# Patient Record
Sex: Female | Born: 1995 | Race: White | Hispanic: No | Marital: Single | Smoking: Former smoker
Health system: Southern US, Community
[De-identification: ages and names within clinical notes are randomized; demographics above are authoritative.]

## PROBLEM LIST (undated history)

## (undated) ENCOUNTER — Inpatient Hospital Stay (HOSPITAL_COMMUNITY): Payer: Self-pay

## (undated) DIAGNOSIS — G43909 Migraine, unspecified, not intractable, without status migrainosus: Secondary | ICD-10-CM

## (undated) DIAGNOSIS — F419 Anxiety disorder, unspecified: Secondary | ICD-10-CM

## (undated) DIAGNOSIS — F32A Depression, unspecified: Secondary | ICD-10-CM

## (undated) DIAGNOSIS — F329 Major depressive disorder, single episode, unspecified: Secondary | ICD-10-CM

## (undated) HISTORY — PX: WISDOM TOOTH EXTRACTION: SHX21

---
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
## 2017-01-11 ENCOUNTER — Encounter: Payer: Self-pay | Admitting: Advanced Practice Midwife

## 2017-01-25 ENCOUNTER — Encounter: Payer: Self-pay | Admitting: Advanced Practice Midwife

## 2017-01-25 ENCOUNTER — Other Ambulatory Visit (HOSPITAL_COMMUNITY)
Admission: RE | Admit: 2017-01-25 | Discharge: 2017-01-25 | Disposition: A | Discharge status: Home / Self Care | Payer: Medicaid Other | Source: Ambulatory Visit | Attending: Advanced Practice Midwife | Admitting: Advanced Practice Midwife

## 2017-01-25 ENCOUNTER — Ambulatory Visit (INDEPENDENT_AMBULATORY_CARE_PROVIDER_SITE_OTHER): Payer: Medicaid Other | Admitting: Advanced Practice Midwife

## 2017-01-25 VITALS — BP 102/67 | HR 73 | Ht 64.0 in | Wt 158.0 lb

## 2017-01-25 DIAGNOSIS — O219 Vomiting of pregnancy, unspecified: Secondary | ICD-10-CM

## 2017-01-25 DIAGNOSIS — Z3492 Encounter for supervision of normal pregnancy, unspecified, second trimester: Secondary | ICD-10-CM

## 2017-01-25 DIAGNOSIS — Z3490 Encounter for supervision of normal pregnancy, unspecified, unspecified trimester: Secondary | ICD-10-CM | POA: Diagnosis not present

## 2017-01-25 DIAGNOSIS — Z3482 Encounter for supervision of other normal pregnancy, second trimester: Secondary | ICD-10-CM | POA: Diagnosis not present

## 2017-01-25 MED ORDER — DOXYLAMINE-PYRIDOXINE 10-10 MG PO TBEC: DELAYED_RELEASE_TABLET | ORAL | 5 refills | Status: DC

## 2017-01-25 NOTE — Patient Instructions (Signed)
Morning Sickness °Morning sickness is when you feel sick to your stomach (nauseous) during pregnancy. This nauseous feeling may or may not come with vomiting. It often occurs in the morning but can be a problem any time of day. Morning sickness is most common during the first trimester, but it may continue throughout pregnancy. While morning sickness is unpleasant, it is usually harmless unless you develop severe and continual vomiting (hyperemesis gravidarum). This condition requires more intense treatment. °What are the causes? °The cause of morning sickness is not completely known but seems to be related to normal hormonal changes that occur in pregnancy. °What increases the risk? °You are at greater risk if you: °· Experienced nausea or vomiting before your pregnancy. °· Had morning sickness during a previous pregnancy. °· Are pregnant with more than one baby, such as twins. °How is this treated? °Do not use any medicines (prescription, over-the-counter, or herbal) for morning sickness without first talking to your health care provider. Your health care provider may prescribe or recommend: °· Vitamin B6 supplements. °· Anti-nausea medicines. °· The herbal medicine ginger. °Follow these instructions at home: °· Only take over-the-counter or prescription medicines as directed by your health care provider. °· Taking multivitamins before getting pregnant can prevent or decrease the severity of morning sickness in most women. °· Eat a piece of dry toast or unsalted crackers before getting out of bed in the morning. °· Eat five or six small meals a day. °· Eat dry and bland foods (rice, baked potato). Foods high in carbohydrates are often helpful. °· Do not drink liquids with your meals. Drink liquids between meals. °· Avoid greasy, fatty, and spicy foods. °· Get someone to cook for you if the smell of any food causes nausea and vomiting. °· If you feel nauseous after taking prenatal vitamins, take the vitamins at  night or with a snack. °· Snack on protein foods (nuts, yogurt, cheese) between meals if you are hungry. °· Eat unsweetened gelatins for desserts. °· Wearing an acupressure wristband (worn for sea sickness) may be helpful. °· Acupuncture may be helpful. °· Do not smoke. °· Get a humidifier to keep the air in your house free of odors. °· Get plenty of fresh air. °Contact a health care provider if: °· Your home remedies are not working, and you need medicine. °· You feel dizzy or lightheaded. °· You are losing weight. °Get help right away if: °· You have persistent and uncontrolled nausea and vomiting. °· You pass out (faint). °This information is not intended to replace advice given to you by your health care provider. Make sure you discuss any questions you have with your health care provider. °Document Released: 07/05/2006 Document Revised: 10/20/2015 Document Reviewed: 10/29/2012 °Elsevier Interactive Patient Education © 2017 [employer] ° °Second Trimester of Pregnancy °The second trimester is from week 14 through week 27 (months 4 through 6). The second trimester is often a time when you feel your best. Your body has adjusted to being pregnant, and you begin to feel better physically. Usually, morning sickness has lessened or quit completely, you may have more energy, and you may have an increase in appetite. The second trimester is also a time when the fetus is growing rapidly. At the end of the sixth month, the fetus is about 9 inches long and weighs about 1½ pounds. You will likely begin to feel the baby move (quickening) between 16 and 20 weeks of pregnancy. °Body changes during your second trimester °Your body continues   to go through many changes during your second trimester. The changes vary from woman to woman. °· Your weight will continue to increase. You will notice your lower abdomen bulging out. °· You may begin to get stretch marks on your hips, abdomen, and breasts. °· You may develop headaches  that can be relieved by medicines. The medicines should be approved by your health care provider. °· You may urinate more often because the fetus is pressing on your bladder. °· You may develop or continue to have heartburn as a result of your pregnancy. °· You may develop constipation because certain hormones are causing the muscles that push waste through your intestines to slow down. °· You may develop hemorrhoids or swollen, bulging veins (varicose veins). °· You may have back pain. This is caused by: °¨ Weight gain. °¨ Pregnancy hormones that are relaxing the joints in your pelvis. °¨ A shift in weight and the muscles that support your balance. °· Your breasts will continue to grow and they will continue to become tender. °· Your gums may bleed and may be sensitive to brushing and flossing. °· Dark spots or blotches (chloasma, mask of pregnancy) may develop on your face. This will likely fade after the baby is born. °· A dark line from your belly button to the pubic area (linea nigra) may appear. This will likely fade after the baby is born. °· You may have changes in your hair. These can include thickening of your hair, rapid growth, and changes in texture. Some women also have hair loss during or after pregnancy, or hair that feels dry or thin. Your hair will most likely return to normal after your baby is born. °What to expect at prenatal visits °During a routine prenatal visit: °· You will be weighed to make sure you and the fetus are growing normally. °· Your blood pressure will be taken. °· Your abdomen will be measured to track your baby's growth. °· The fetal heartbeat will be listened to. °· Any test results from the previous visit will be discussed. °Your health care provider may ask you: °· How you are feeling. °· If you are feeling the baby move. °· If you have had any abnormal symptoms, such as leaking fluid, bleeding, severe headaches, or abdominal cramping. °· If you are using any tobacco  products, including cigarettes, chewing tobacco, and electronic cigarettes. °· If you have any questions. °Other tests that may be performed during your second trimester include: °· Blood tests that check for: °¨ Low iron levels (anemia). °¨ High blood sugar that affects pregnant women (gestational diabetes) between 24 and 28 weeks. °¨ Rh antibodies. This is to check for a protein on red blood cells (Rh factor). °· Urine tests to check for infections, diabetes, or protein in the urine. °· An ultrasound to confirm the proper growth and development of the baby. °· An amniocentesis to check for possible genetic problems. °· Fetal screens for spina bifida and Down syndrome. °· HIV (human immunodeficiency virus) testing. Routine prenatal testing includes screening for HIV, unless you choose not to have this test. °Follow these instructions at home: °Medicines  °· Follow your health care provider's instructions regarding medicine use. Specific medicines may be either safe or unsafe to take during pregnancy. °· Take a prenatal vitamin that contains at least 600 micrograms (mcg) of folic acid. °· If you develop constipation, try taking a stool softener if your health care provider approves. °Eating and drinking  °· Eat a balanced diet   that includes fresh fruits and vegetables, whole grains, good sources of protein such as meat, eggs, or tofu, and low-fat dairy. Your health care provider will help you determine the amount of weight gain that is right for you. °· Avoid raw meat and uncooked cheese. These carry germs that can cause birth defects in the baby. °· If you have low calcium intake from food, talk to your health care provider about whether you should take a daily calcium supplement. °· Limit foods that are high in fat and processed sugars, such as fried and sweet foods. °· To prevent constipation: °¨ Drink enough fluid to keep your urine clear or pale yellow. °¨ Eat foods that are high in fiber, such as fresh fruits  and vegetables, whole grains, and beans. °Activity  °· Exercise only as directed by your health care provider. Most women can continue their usual exercise routine during pregnancy. Try to exercise for 30 minutes at least 5 days a week. Stop exercising if you experience uterine contractions. °· Avoid heavy lifting, wear low heel shoes, and practice good posture. °· A sexual relationship may be continued unless your health care provider directs you otherwise. °Relieving pain and discomfort  °· Wear a good support bra to prevent discomfort from breast tenderness. °· Take warm sitz baths to soothe any pain or discomfort caused by hemorrhoids. Use hemorrhoid cream if your health care provider approves. °· Rest with your legs elevated if you have leg cramps or low back pain. °· If you develop varicose veins, wear support hose. Elevate your feet for 15 minutes, 3-4 times a day. Limit salt in your diet. °Prenatal Care  °· Write down your questions. Take them to your prenatal visits. °· Keep all your prenatal visits as told by your health care provider. This is important. °Safety  °· Wear your seat belt at all times when driving. °· Make a list of emergency phone numbers, including numbers for family, friends, the hospital, and police and fire departments. °General instructions  °· Ask your health care provider for a referral to a local prenatal education class. Begin classes no later than the beginning of month 6 of your pregnancy. °· Ask for help if you have counseling or nutritional needs during pregnancy. Your health care provider can offer advice or refer you to specialists for help with various needs. °· Do not use hot tubs, steam rooms, or saunas. °· Do not douche or use tampons or scented sanitary pads. °· Do not cross your legs for long periods of time. °· Avoid cat litter boxes and soil used by cats. These carry germs that can cause birth defects in the baby and possibly loss of the fetus by miscarriage or  stillbirth. °· Avoid all smoking, herbs, alcohol, and unprescribed drugs. Chemicals in these products can affect the formation and growth of the baby. °· Do not use any products that contain nicotine or tobacco, such as cigarettes and e-cigarettes. If you need help quitting, ask your health care provider. °· Visit your dentist if you have not gone yet during your pregnancy. Use a soft toothbrush to brush your teeth and be gentle when you floss. °Contact a health care provider if: °· You have dizziness. °· You have mild pelvic cramps, pelvic pressure, or nagging pain in the abdominal area. °· You have persistent nausea, vomiting, or diarrhea. °· You have a bad smelling vaginal discharge. °· You have pain when you urinate. °Get help right away if: °· You have a fever. °·   You are leaking fluid from your vagina. °· You have spotting or bleeding from your vagina. °· You have severe abdominal cramping or pain. °· You have rapid weight gain or weight loss. °· You have shortness of breath with chest pain. °· You notice sudden or extreme swelling of your face, hands, ankles, feet, or legs. °· You have not felt your baby move in over an hour. °· You have severe headaches that do not go away when you take medicine. °· You have vision changes. °Summary °· The second trimester is from week 14 through week 27 (months 4 through 6). It is also a time when the fetus is growing rapidly. °· Your body goes through many changes during pregnancy. The changes vary from woman to woman. °· Avoid all smoking, herbs, alcohol, and unprescribed drugs. These chemicals affect the formation and growth your baby. °· Do not use any tobacco products, such as cigarettes, chewing tobacco, and e-cigarettes. If you need help quitting, ask your health care provider. °· Contact your health care provider if you have any questions. Keep all prenatal visits as told by your health care provider. This is important. °This information is not intended to replace  advice given to you by your health care provider. Make sure you discuss any questions you have with your health care provider. °Document Released: 05/08/2001 Document Revised: 10/20/2015 Document Reviewed: 07/15/2012 °Elsevier Interactive Patient Education © 2017 [employer] ° °

## 2017-01-25 NOTE — Addendum Note (Signed)
Addended by: Faythe CasaBELLAMY, Shyla Gayheart M on: 01/25/2017 12:25 PM   Modules accepted: Orders

## 2017-01-25 NOTE — Progress Notes (Signed)
   PRENATAL VISIT NOTE  Subjective:  Shari Skinner is a 21 y.o. G2P0010 at 3719w4d being seen today for initial prenatal visit.  She is currently monitored for the following issues for this low-risk pregnancy and  does not have a problem list on file.  Patient reports fatigue and nausea. Reports vomiting at least once daily, nausea all day long.  Contractions: Not present. Vag. Bleeding: None.   . Denies leaking of fluid.   The following portions of the patient's history were reviewed and updated as appropriate: allergies, current medications, past family history, past medical history, past social history, past surgical history and problem list. Problem list updated.  Objective:   Vitals:   01/25/17 0959 01/25/17 1001  BP: 102/67   Pulse: 73   Weight: 158 lb (71.7 kg)   Height:  5\' 4"  (1.626 m)    Fetal Status: Fetal Heart Rate (bpm): 148         VS reviewed, nursing note reviewed,  Constitutional: well developed, well nourished, no distress HEENT: normocephalic HEART: normal rate, heart sounds, regular rhythm RESP: normal effort, lung sounds clear and equal bilaterally Abdomen: soft Neuro: alert and oriented x 3 Skin: warm, dry Psych: affect normal Pelvic: deferred, too young for Pap  Assessment and Plan:  Pregnancy: G2P0010 at 319w4d  1. Encounter for supervision of low-risk pregnancy, antepartum  - Culture, OB Urine - GC/Chlamydia probe amp ([facility]not at [facility] - Cystic fibrosis diagnostic study - AFP, Quad Screen - Hemoglobin A1c - Prenatal Profile - US MFM OB COMP + 14 WK; Future  2. Nausea and vomiting during pregnancy prior to [redacted] weeks gestation --Discussed dietary changes, small frequent meals, bland foods --Sample of Diclegis given, will need prior authorization for pharmacy to fill Rx. - Doxylamine-Pyridoxine (DICLEGIS) 10-10 MG TBEC; Take 2 tabs at bedtime. If needed, add another tab in the morning. If needed, add another tab in the afternoon, up to 4  tabs/day.  Dispense: 100 tablet; Refill: 5  Preterm labor symptoms and general obstetric precautions including but not limited to vaginal bleeding, contractions, leaking of fluid and fetal movement were reviewed in detail with the patient. Please refer to After Visit Summary for other counseling recommendations.  Return in about 4 weeks (around 02/22/2017).   Sharen CounterLisa Leftwich-Kirby, CNM

## 2017-01-26 LAB — HEMOGLOBIN A1C
HEMOGLOBIN A1C: 5 % (ref <5.7)
MEAN PLASMA GLUCOSE: 97 mg/dL

## 2017-01-26 LAB — CULTURE, OB URINE

## 2017-01-29 LAB — PRENATAL PROFILE (SOLSTAS)
ANTIBODY SCREEN: NEGATIVE
Basophils Absolute: 0 cells/uL (ref 0–200)
Basophils Relative: 0 %
Eosinophils Absolute: 0 cells/uL — ABNORMAL LOW (ref 15–500)
Eosinophils Relative: 0 %
HEMATOCRIT: 40.6 % (ref 35.0–45.0)
HEMOGLOBIN: 13.3 g/dL (ref 11.7–15.5)
HEP B S AG: NONREACTIVE
HIV 1&2 Ab, 4th Generation: NONREACTIVE
Lymphocytes Relative: 24 %
Lymphs Abs: 2160 cells/uL (ref 850–3900)
MCH: 30.6 pg (ref 27.0–33.0)
MCHC: 32.8 g/dL (ref 32.0–36.0)
MCV: 93.3 fL (ref 80.0–100.0)
MONOS PCT: 5 %
MPV: 10.6 fL (ref 7.5–12.5)
Monocytes Absolute: 450 cells/uL (ref 200–950)
NEUTROS ABS: 6390 {cells}/uL (ref 1500–7800)
NEUTROS PCT: 71 %
Platelets: 202 10*3/uL (ref 140–400)
RBC: 4.35 MIL/uL (ref 3.80–5.10)
RDW: 13.2 % (ref 11.0–15.0)
Rh Type: NEGATIVE
WBC: 9 10*3/uL (ref 3.8–10.8)

## 2017-01-30 LAB — AFP, QUAD SCREEN
AFP: 24.5 ng/mL
Age Alone: 1:1170 {titer}
CURR GEST AGE: 14.6 wk
Down Syndrome Scr Risk Est: 1:876 {titer}
HCG, Total: 49.63 IU/mL
INH: 490.6 pg/mL
INTERPRETATION-AFP: NEGATIVE
MoM for AFP: 0.91
MoM for INH: 2.67
MoM for hCG: 0.92
OPEN SPINA BIFIDA: NEGATIVE
TRI 18 SCR RISK EST: NEGATIVE
Trisomy 18 (Edward) Syndrome Interp.: 1:67300 {titer}
UE3 VALUE: 0.48 ng/mL
uE3 Mom: 0.99

## 2017-01-31 LAB — GC/CHLAMYDIA PROBE AMP (~~LOC~~) NOT AT ARMC
Chlamydia: NEGATIVE
Neisseria Gonorrhea: NEGATIVE

## 2017-02-05 ENCOUNTER — Encounter: Payer: Self-pay | Admitting: *Deleted

## 2017-02-12 ENCOUNTER — Ambulatory Visit (INDEPENDENT_AMBULATORY_CARE_PROVIDER_SITE_OTHER): Payer: Medicaid Other | Admitting: Obstetrics & Gynecology

## 2017-02-12 ENCOUNTER — Encounter: Payer: Self-pay | Admitting: Obstetrics & Gynecology

## 2017-02-12 VITALS — BP 109/70 | HR 70 | Wt 152.0 lb

## 2017-02-12 DIAGNOSIS — Z23 Encounter for immunization: Secondary | ICD-10-CM

## 2017-02-12 DIAGNOSIS — Z3490 Encounter for supervision of normal pregnancy, unspecified, unspecified trimester: Secondary | ICD-10-CM | POA: Diagnosis not present

## 2017-02-12 NOTE — Addendum Note (Signed)
Addended by: Kathie Dike on: 02/12/2017 02:12 PM   Modules accepted: Orders

## 2017-02-12 NOTE — Progress Notes (Signed)
PT complains of cramping in lower abdomen

## 2017-02-12 NOTE — Progress Notes (Signed)
   PRENATAL VISIT NOTE  Subjective:  Yeni Jiggetts is a 21 y.o. G2P0010 at [redacted]w[redacted]d being seen today for ongoing prenatal care.  She is currently monitored for the following issues for this low-risk pregnancy and has Encounter for supervision of low-risk pregnancy, antepartum and Nausea and vomiting during pregnancy prior to [redacted] weeks gestation on her problem list.  Patient reports This is an unscheduled visit for a week's worth of pelvic cramping, no bleeding..  Contractions: Not present. Vag. Bleeding: None.  Movement: Present. Denies leaking of fluid.   The following portions of the patient's history were reviewed and updated as appropriate: allergies, current medications, past family history, past medical history, past social history, past surgical history and problem list. Problem list updated.  Objective:   Vitals:   02/12/17 1335  BP: 109/70  Pulse: 70  Weight: 152 lb (68.9 kg)    Fetal Status: Fetal Heart Rate (bpm): 146   Movement: Present     General:  Alert, oriented and cooperative. Patient is in no acute distress.  Skin: Skin is warm and dry. No rash noted.   Cardiovascular: Normal heart rate noted  Respiratory: Normal respiratory effort, no problems with respiration noted  Abdomen: Soft, gravid, appropriate for gestational age.  Pain/Pressure: Present     Pelvic: Cervical exam performed        Extremities: Normal range of motion.  Edema: Trace  Mental Status:  Normal mood and affect. Normal behavior. Normal judgment and thought content.   Assessment and Plan:  Pregnancy: G2P0010 at [redacted]w[redacted]d  1. Encounter for supervision of low-risk pregnancy, antepartum - Quad screen today - anatomy u/s next week - Flu Vaccine QUAD 36+ mos IM (Fluarix, Quad PF) - Reassurance regarding the cramping given  Preterm labor symptoms and general obstetric precautions including but not limited to vaginal bleeding, contractions, leaking of fluid and fetal movement were reviewed in detail with  the patient. Please refer to After Visit Summary for other counseling recommendations.  No Follow-up on file.   Allie Bossier, MD

## 2017-02-13 LAB — AFP, QUAD SCREEN
AFP: 37.6 ng/mL
CURR GEST AGE: 17.1 wk
DOWN SYNDROME SCR RISK EST: POSITIVE — AB
HCG, Total: 33.66 IU/mL
INH: 398 pg/mL
MATERNAL WT: 152 [lb_av]
MoM for AFP: 1
MoM for INH: 2.42
MoM for hCG: 1.23
Trisomy 18 (Edward) Syndrome Interp.: <1
Twins-AFP: 1
UE3 VALUE: 0.56 ng/mL
uE3 Mom: 0.55

## 2017-02-13 LAB — URINE CULTURE
MICRO NUMBER:: 81030227
RESULT: NO GROWTH
SPECIMEN QUALITY:: ADEQUATE

## 2017-02-14 ENCOUNTER — Other Ambulatory Visit: Payer: Self-pay | Admitting: *Deleted

## 2017-02-14 DIAGNOSIS — Z3490 Encounter for supervision of normal pregnancy, unspecified, unspecified trimester: Secondary | ICD-10-CM

## 2017-02-14 DIAGNOSIS — O0990 Supervision of high risk pregnancy, unspecified, unspecified trimester: Secondary | ICD-10-CM

## 2017-02-14 NOTE — Progress Notes (Signed)
Pt notified of positive Quad screen.  Appt made with MFM and genetic counselor to discuss.  Appt 02/25/17 @ 9:15

## 2017-02-22 ENCOUNTER — Ambulatory Visit (INDEPENDENT_AMBULATORY_CARE_PROVIDER_SITE_OTHER): Payer: Medicaid Other | Admitting: Certified Nurse Midwife

## 2017-02-22 VITALS — BP 92/60 | HR 80 | Wt 153.0 lb

## 2017-02-22 DIAGNOSIS — F329 Major depressive disorder, single episode, unspecified: Secondary | ICD-10-CM

## 2017-02-22 DIAGNOSIS — O219 Vomiting of pregnancy, unspecified: Secondary | ICD-10-CM

## 2017-02-22 DIAGNOSIS — F32A Depression, unspecified: Secondary | ICD-10-CM | POA: Insufficient documentation

## 2017-02-22 DIAGNOSIS — F419 Anxiety disorder, unspecified: Secondary | ICD-10-CM

## 2017-02-22 DIAGNOSIS — Z3482 Encounter for supervision of other normal pregnancy, second trimester: Secondary | ICD-10-CM

## 2017-02-22 DIAGNOSIS — O285 Abnormal chromosomal and genetic finding on antenatal screening of mother: Secondary | ICD-10-CM

## 2017-02-22 DIAGNOSIS — O2612 Low weight gain in pregnancy, second trimester: Secondary | ICD-10-CM

## 2017-02-22 DIAGNOSIS — O2610 Low weight gain in pregnancy, unspecified trimester: Secondary | ICD-10-CM | POA: Insufficient documentation

## 2017-02-22 DIAGNOSIS — Z3490 Encounter for supervision of normal pregnancy, unspecified, unspecified trimester: Secondary | ICD-10-CM

## 2017-02-22 MED ORDER — SERTRALINE HCL 25 MG PO TABS: 25.0000 mg | ORAL_TABLET | Freq: Every day | ORAL | 1 refills | Status: DC

## 2017-02-22 MED ORDER — METOCLOPRAMIDE HCL 10 MG PO TABS: 10.0000 mg | ORAL_TABLET | Freq: Four times a day (QID) | ORAL | 1 refills | Status: DC | PRN

## 2017-02-22 NOTE — Progress Notes (Signed)
Pt is non-compliant because she stated that she has never received a BP cuff from [facility].  Given pt the number to call and let them know. Discuss her abn AFP

## 2017-02-22 NOTE — Progress Notes (Signed)
Subjective:  Shari Skinner is a 21 y.o. G2P0010 at [redacted]w[redacted]d being seen today for ongoing prenatal care.  She is currently monitored for the following issues for this low-risk pregnancy and has Encounter for supervision of low-risk pregnancy, antepartum; Nausea and vomiting during pregnancy prior to [redacted] weeks gestation; Anxiety and depression; and Abnormal chromosomal and genetic finding on antenatal screening mother on her problem list.  Patient reports nausea, vomiting and N/V still occurring most days. Using 1 tab of Diclegis at hs, has Zofran and Phenergan but doesn't use. Anxiety most days and feeling sad and angry. No SI/HI. Hx of depression and anxiety prior to pregnancy and stopped meds..  Contractions: Not present. Vag. Bleeding: None.  Movement: Present. Denies leaking of fluid.   The following portions of the patient's history were reviewed and updated as appropriate: allergies, current medications, past family history, past medical history, past social history, past surgical history and problem list. Problem list updated.  Objective:   Vitals:   02/22/17 0838  BP: 92/60  Pulse: 80  Weight: 153 lb (69.4 kg)    Fetal Status: Fetal Heart Rate (bpm): 150 Fundal Height: 18 cm Movement: Present     General:  Alert, oriented and cooperative. Patient is in no acute distress.  Skin: Skin is warm and dry. No rash noted.   Cardiovascular: Normal heart rate noted  Respiratory: Normal respiratory effort, no problems with respiration noted  Abdomen: Soft, gravid, appropriate for gestational age. Pain/Pressure: Absent     Pelvic: Vag. Bleeding: None Vag D/C Character: Thin   Cervical exam deferred        Extremities: Normal range of motion.  Edema: Trace  Mental Status: Normal mood and affect. Normal behavior. Normal judgment and thought content.   Urinalysis: Urine Protein: Negative Urine Glucose: Negative  Assessment and Plan:  Pregnancy: G2P0010 at [redacted]w[redacted]d  1. Encounter for supervision  of low-risk pregnancy, antepartum  2. Nausea and vomiting during pregnancy prior to [redacted] weeks gestation - increase Diclegis to 2 tabs at hs - metoCLOPramide (REGLAN) 10 MG tablet; Take 1 tablet (10 mg total) by mouth every 6 (six) hours as needed for nausea.  Dispense: 30 tablet; Refill: 1  3. Anxiety and depression - Ambulatory referral to Behavioral Health - sertraline (ZOLOFT) 25 MG tablet; Take 1 tablet (25 mg total) by mouth daily.  Dispense: 30 tablet; Refill: 1  4. Abnormal genetic screen - quad: risk 1 in 206 for Down - MFM [place] and genetic counseling scheduled  Preterm labor symptoms and general obstetric precautions including but not limited to vaginal bleeding, contractions, leaking of fluid and fetal movement were reviewed in detail with the patient. Please refer to After Visit Summary for other counseling recommendations.  Return in about 4 weeks (around 03/22/2017).   Donette Larry, CNM

## 2017-02-25 ENCOUNTER — Ambulatory Visit (HOSPITAL_COMMUNITY)
Admission: RE | Admit: 2017-02-25 | Discharge: 2017-02-25 | Disposition: A | Discharge status: Home / Self Care | Payer: Medicaid Other | Source: Ambulatory Visit | Attending: Obstetrics & Gynecology | Admitting: Obstetrics & Gynecology

## 2017-02-25 ENCOUNTER — Other Ambulatory Visit: Payer: Self-pay | Admitting: Obstetrics & Gynecology

## 2017-02-25 ENCOUNTER — Encounter (HOSPITAL_COMMUNITY): Payer: Self-pay

## 2017-02-25 ENCOUNTER — Ambulatory Visit (HOSPITAL_COMMUNITY): Payer: Medicaid Other

## 2017-02-25 DIAGNOSIS — Z363 Encounter for antenatal screening for malformations: Secondary | ICD-10-CM | POA: Insufficient documentation

## 2017-02-25 DIAGNOSIS — O281 Abnormal biochemical finding on antenatal screening of mother: Secondary | ICD-10-CM | POA: Diagnosis not present

## 2017-02-25 DIAGNOSIS — O0990 Supervision of high risk pregnancy, unspecified, unspecified trimester: Secondary | ICD-10-CM | POA: Diagnosis not present

## 2017-02-25 DIAGNOSIS — Z3A19 19 weeks gestation of pregnancy: Secondary | ICD-10-CM

## 2017-02-25 DIAGNOSIS — O285 Abnormal chromosomal and genetic finding on antenatal screening of mother: Secondary | ICD-10-CM

## 2017-02-25 DIAGNOSIS — O289 Unspecified abnormal findings on antenatal screening of mother: Secondary | ICD-10-CM | POA: Diagnosis not present

## 2017-02-25 HISTORY — DX: Depression, unspecified: F32.A

## 2017-02-25 HISTORY — DX: Anxiety disorder, unspecified: F41.9

## 2017-02-25 HISTORY — DX: Major depressive disorder, single episode, unspecified: F32.9

## 2017-02-25 NOTE — Progress Notes (Signed)
Genetic Counseling  High-Risk Gestation Note  Appointment Date:  02/25/2017 Referred By: Medicine, [facility]* Date of Birth:  1996-01-31 Partner:  Shari Skinner   Pregnancy History: G2P0010 Estimated Date of Delivery: 07/22/17 Estimated Gestational Age: [redacted]w[redacted]d Attending: Particia Nearing, MD   Ms. Shari Skinner and her partner, Mr. Shari Skinner, were seen for genetic counseling because of an increased risk for fetal Down syndrome based on Quad screen through [employer].  In summary:  Reviewed results of Quad screening test  Increased risk for Down syndrome (1 in 206)  Elevated DIA (2.42 MoM)- association with increased risk for adverse pregnancy outcomes  Discussed additional screening options  NIPS  Ultrasound  Discussed diagnostic testing options  Amniocentesis  Reviewed family history concerns  Discussed general population carrier screening options  CF- pending through Ob provider  SMA- elected to pursue today ([facility])  Hemoglobinopathies-declined  They were counseled regarding the screening result and the associated 1 in 206 risk for fetal Down syndrome.  We reviewed chromosomes, nondisjunction, and the common features and variable prognosis of Down syndrome.  In addition, we reviewed the screen adjusted reduction in risks for trisomy 18 and open neural tube defects.  We also discussed other explanations for a screen positive result including: a gestational dating error, differences in maternal metabolism, and normal variation. We specifically discussed that the level of one of the proteins analyzed on the screen, DIA, was very high (2.42 MoM).  This has been associated with an increased risk for growth restriction or poor pregnancy outcome later in pregnancy; therefore, we would recommend a follow up ultrasound for fetal growth in the third trimester.  We reviewed other available screening options including noninvasive prenatal screening (NIPS)/cell free  DNA (cfDNA) screening, and detailed ultrasound. They were counseled that screening tests are used to modify a patient's a priori risk for aneuploidy, typically based on age. This estimate provides a pregnancy specific risk assessment. We reviewed the benefits and limitations of each option. Specifically, we discussed the conditions for which each test screens, the detection rates, and false positive rates of each. They were also counseled regarding diagnostic testing via amniocentesis. We reviewed the approximate 1 in 300-500 risk for complications from amniocentesis, including spontaneous pregnancy loss. We discussed the possible results that the tests might provide including: positive, negative, unanticipated, and no result. Finally, they were counseled regarding the cost of each option and potential out of pocket expenses.   After consideration of all the options, she elected to proceed with NIPS (Panorama through [facility]).  Those results will be available in 8-10 days.  The patient also expressed interest in having a detailed ultrasound.  A complete ultrasound was performed today. The ultrasound report will be sent under separate cover. There were no visualized fetal anomalies or markers suggestive of aneuploidy. Diagnostic testing was declined today.  They understand that screening tests cannot rule out all birth defects or genetic syndromes. The patient was advised of this limitation and states she still does not want additional testing at this time.   Ms Shari Skinner was provided with written information regarding cystic fibrosis (CF), spinal muscular atrophy (SMA) and hemoglobinopathies including the carrier frequency, availability of carrier screening and prenatal diagnosis if indicated.  In addition, we discussed that CF and hemoglobinopathies are routinely screened for as part of the [place] newborn screening panel.  CF carrier screening was drawn through her OB provider on 01/25/17 and is  currently pending. After further discussion, she declined hemoglobinopathies screening but elected  to pursue SMA carrier screening today ([facility]).   Both family histories were reviewed and found to be contributory for a maternal half-nephew to the patient with congenital hole in the heart, which resolved during the first year of life. He did not required medical treatment. We reviewed that congenital heart defects (CHD) can be isolated or a feature of an underlying genetic condition. Congenital heart defects are most often multifactorial in etiology, but can also result from chromosome aberrations, single gene conditions, or teratogenic exposures. We discussed that isolated, nonsyndromic CHDs occur in approximately 1% of the general population. If [name] relative had an isolated CHD, the risk of recurrence is not expected to be increased above the general population risk for her offspring, given the reported family history. If however, her relative had an underlying genetic condition that caused the CHD, the risk of recurrence could be increased. In this case, the specific chance of recurrence depends on the inheritance of the condition. Without further information, an accurate risk assessment cannot be provided. We discussed the availability of a detailed anatomy ultrasound to assess the development of the heart during the pregnancy. Further genetic counseling is warranted if more information is obtained.  Ms. Shari Skinner denied exposure to environmental toxins or chemical agents. She denied the use of alcohol or street drugs. She reported smoking cessation in June 2018. She denied significant viral illnesses during the course of her pregnancy.   I counseled this couple for approximately 45 minutes regarding the above risks and available options.   Shari Plowman, MS,  Certified Genetic Counselor 02/25/2017

## 2017-02-26 ENCOUNTER — Other Ambulatory Visit (HOSPITAL_COMMUNITY): Payer: Self-pay | Admitting: *Deleted

## 2017-02-26 DIAGNOSIS — O281 Abnormal biochemical finding on antenatal screening of mother: Secondary | ICD-10-CM

## 2017-03-02 ENCOUNTER — Other Ambulatory Visit: Payer: Self-pay

## 2017-03-04 ENCOUNTER — Telehealth (HOSPITAL_COMMUNITY): Payer: Self-pay | Admitting: MS"

## 2017-03-04 NOTE — Telephone Encounter (Signed)
Called Shari Skinner to discuss her prenatal cell free DNA test results.  Mrs. Tommye Standard had Panorama testing through [facility].  Testing was offered because of maternal serum screening results.   The patient was identified by name and DOB.  We reviewed that these are within normal limits, showing a less than 1 in 10,000 risk for trisomies 21, 18 and 13, and monosomy X (Turner syndrome).  In addition, the risk for triploidy and sex chromosome trisomies (47,XXX and 47,XXY) was also low risk.  We reviewed that this testing identifies > 99% of pregnancies with trisomy 51, trisomy 10, sex chromosome trisomies (47,XXX and 47,XXY), and triploidy. The detection rate for trisomy 18 is 96%.  The detection rate for monosomy X is ~92%.  The false positive rate is <0.1% for all conditions. Testing was also consistent with female fetal sex.  The patient did wish to know fetal sex.  She understands that this testing does not identify all genetic conditions.  All questions were answered to her satisfaction, she was encouraged to call with additional questions or concerns.  Quinn Plowman, MS Certified Genetic Counselor 03/04/2017 9:37 AM

## 2017-03-07 ENCOUNTER — Telehealth (HOSPITAL_COMMUNITY): Payer: Self-pay | Admitting: MS"

## 2017-03-07 NOTE — Telephone Encounter (Signed)
Called Ms. Shari Skinner to discuss her carrier screening results. Shari Skinner had SMA carrier screening through [facility]. The patient was identified by name and DOB. We reviewed that the results are negative/within normal limits. 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 We reviewed that carrier screening does not detect all carriers of these conditions, but a normal result significantly decreases the likelihood of being a carrier, and therefore, the overall reproductive risk. All questions were answered to her satisfaction, she was encouraged to call with additional questions or concerns. ? Quinn Plowman, MS Patent attorney

## 2017-03-12 ENCOUNTER — Other Ambulatory Visit: Payer: Self-pay

## 2017-03-22 ENCOUNTER — Ambulatory Visit (INDEPENDENT_AMBULATORY_CARE_PROVIDER_SITE_OTHER): Payer: Medicaid Other | Admitting: Advanced Practice Midwife

## 2017-03-22 VITALS — BP 108/66 | HR 68 | Wt 163.0 lb

## 2017-03-22 DIAGNOSIS — F32A Depression, unspecified: Secondary | ICD-10-CM

## 2017-03-22 DIAGNOSIS — Z3490 Encounter for supervision of normal pregnancy, unspecified, unspecified trimester: Secondary | ICD-10-CM

## 2017-03-22 DIAGNOSIS — Z3482 Encounter for supervision of other normal pregnancy, second trimester: Secondary | ICD-10-CM

## 2017-03-22 DIAGNOSIS — O285 Abnormal chromosomal and genetic finding on antenatal screening of mother: Secondary | ICD-10-CM

## 2017-03-22 DIAGNOSIS — F419 Anxiety disorder, unspecified: Secondary | ICD-10-CM

## 2017-03-22 DIAGNOSIS — F329 Major depressive disorder, single episode, unspecified: Secondary | ICD-10-CM

## 2017-03-22 NOTE — Patient Instructions (Signed)

## 2017-03-22 NOTE — Progress Notes (Signed)
   PRENATAL VISIT NOTE  Subjective:  Shari Skinner is a 21 y.o. G2P0010 at 5083w4d being seen today for ongoing prenatal care.  She is currently monitored for the following issues for this low-risk pregnancy and has Encounter for supervision of low-risk pregnancy, antepartum; Nausea and vomiting during pregnancy prior to [redacted] weeks gestation; Anxiety and depression; Abnormal chromosomal and genetic finding on antenatal screening mother; and Poor weight gain of pregnancy on her problem list.  Patient reports no complaints.  Contractions: Not present. Vag. Bleeding: None.  Movement: Present. Denies leaking of fluid.   The following portions of the patient's history were reviewed and updated as appropriate: allergies, current medications, past family history, past medical history, past social history, past surgical history and problem list. Problem list updated.  Objective:   Vitals:   03/22/17 0953  BP: 108/66  Pulse: 68  Weight: 163 lb (73.9 kg)    Fetal Status: Fetal Heart Rate (bpm): 150   Movement: Present     General:  Alert, oriented and cooperative. Patient is in no acute distress.  Skin: Skin is warm and dry. No rash noted.   Cardiovascular: Normal heart rate noted  Respiratory: Normal respiratory effort, no problems with respiration noted  Abdomen: Soft, gravid, appropriate for gestational age.  Pain/Pressure: Absent     Pelvic: Cervical exam deferred        Extremities: Normal range of motion.  Edema: Trace  Mental Status:  Normal mood and affect. Normal behavior. Normal judgment and thought content.   Assessment and Plan:  Pregnancy: G2P0010 at 3883w4d  1. Encounter for supervision of low-risk pregnancy, antepartum - Routine care  - 2 hour GTT at next visit   2. Abnormal chromosomal and genetic finding on antenatal screening mother - Quad screen abnormal/short long bones on US, NIPS normal  - 4 week growth US rec from MFM. Scheduled for 03/29/17   3. Anxiety and  Depression - Took one dose of zoloft, "felt weird and zoned out" and then didn't feel the baby move. Reassured. Ok to try again. If taking x 2 weeks and still having side effects then we can consider changing to something different.   Preterm labor symptoms and general obstetric precautions including but not limited to vaginal bleeding, contractions, leaking of fluid and fetal movement were reviewed in detail with the patient. Please refer to After Visit Summary for other counseling recommendations.  Return in about 5 weeks (around 04/26/2017).   Thressa ShellerHeather Andrei Mccook, CNM

## 2017-03-27 ENCOUNTER — Ambulatory Visit (HOSPITAL_COMMUNITY): Payer: Medicaid Other | Admitting: Licensed Clinical Social Worker

## 2017-03-29 ENCOUNTER — Other Ambulatory Visit (HOSPITAL_COMMUNITY): Payer: Self-pay | Admitting: *Deleted

## 2017-03-29 ENCOUNTER — Encounter (HOSPITAL_COMMUNITY): Payer: Self-pay

## 2017-03-29 ENCOUNTER — Ambulatory Visit (HOSPITAL_COMMUNITY)
Admission: RE | Admit: 2017-03-29 | Discharge: 2017-03-29 | Disposition: A | Discharge status: Home / Self Care | Payer: Medicaid Other | Source: Ambulatory Visit | Attending: Obstetrics & Gynecology | Admitting: Obstetrics & Gynecology

## 2017-03-29 DIAGNOSIS — O321XX0 Maternal care for breech presentation, not applicable or unspecified: Secondary | ICD-10-CM | POA: Insufficient documentation

## 2017-03-29 DIAGNOSIS — O281 Abnormal biochemical finding on antenatal screening of mother: Secondary | ICD-10-CM | POA: Diagnosis present

## 2017-03-29 DIAGNOSIS — Z3A23 23 weeks gestation of pregnancy: Secondary | ICD-10-CM | POA: Insufficient documentation

## 2017-04-26 ENCOUNTER — Ambulatory Visit (INDEPENDENT_AMBULATORY_CARE_PROVIDER_SITE_OTHER): Payer: Medicaid Other | Admitting: Advanced Practice Midwife

## 2017-04-26 ENCOUNTER — Ambulatory Visit (HOSPITAL_COMMUNITY)
Admission: RE | Admit: 2017-04-26 | Discharge: 2017-04-26 | Disposition: A | Discharge status: Home / Self Care | Payer: Medicaid Other | Source: Ambulatory Visit | Attending: Obstetrics & Gynecology | Admitting: Obstetrics & Gynecology

## 2017-04-26 ENCOUNTER — Other Ambulatory Visit (HOSPITAL_COMMUNITY): Payer: Self-pay | Admitting: Maternal & Fetal Medicine

## 2017-04-26 ENCOUNTER — Encounter (HOSPITAL_COMMUNITY): Payer: Self-pay

## 2017-04-26 VITALS — BP 123/56 | HR 78 | Wt 170.0 lb

## 2017-04-26 DIAGNOSIS — Z362 Encounter for other antenatal screening follow-up: Secondary | ICD-10-CM | POA: Insufficient documentation

## 2017-04-26 DIAGNOSIS — Z23 Encounter for immunization: Secondary | ICD-10-CM

## 2017-04-26 DIAGNOSIS — Z3492 Encounter for supervision of normal pregnancy, unspecified, second trimester: Secondary | ICD-10-CM

## 2017-04-26 DIAGNOSIS — Z3A27 27 weeks gestation of pregnancy: Secondary | ICD-10-CM

## 2017-04-26 DIAGNOSIS — O26899 Other specified pregnancy related conditions, unspecified trimester: Secondary | ICD-10-CM

## 2017-04-26 DIAGNOSIS — O281 Abnormal biochemical finding on antenatal screening of mother: Secondary | ICD-10-CM

## 2017-04-26 DIAGNOSIS — Z6791 Unspecified blood type, Rh negative: Secondary | ICD-10-CM

## 2017-04-26 DIAGNOSIS — O360120 Maternal care for anti-D [Rh] antibodies, second trimester, not applicable or unspecified: Secondary | ICD-10-CM | POA: Diagnosis not present

## 2017-04-26 DIAGNOSIS — O2612 Low weight gain in pregnancy, second trimester: Secondary | ICD-10-CM

## 2017-04-26 DIAGNOSIS — Z3490 Encounter for supervision of normal pregnancy, unspecified, unspecified trimester: Secondary | ICD-10-CM

## 2017-04-26 DIAGNOSIS — O285 Abnormal chromosomal and genetic finding on antenatal screening of mother: Secondary | ICD-10-CM

## 2017-04-26 DIAGNOSIS — O09892 Supervision of other high risk pregnancies, second trimester: Secondary | ICD-10-CM

## 2017-04-26 DIAGNOSIS — O283 Abnormal ultrasonic finding on antenatal screening of mother: Secondary | ICD-10-CM | POA: Insufficient documentation

## 2017-04-26 MED ORDER — RHO D IMMUNE GLOBULIN 1500 UNIT/2ML IJ SOSY
300.0000 ug | PREFILLED_SYRINGE | Freq: Once | INTRAMUSCULAR | Status: AC
  Administered 2017-04-26: 300 ug via INTRAMUSCULAR

## 2017-04-26 NOTE — Patient Instructions (Signed)
[email or web address]   Preterm Labor and Birth Information The normal length of a pregnancy is 39-41 weeks. Preterm labor is when labor starts before 37 completed weeks of pregnancy. What are the risk factors for preterm labor? Preterm labor is more likely to occur in women who:  Have certain infections during pregnancy such as a bladder infection, sexually transmitted infection, or infection inside the uterus (chorioamnionitis).  Have a shorter-than-normal cervix.  Have gone into preterm labor before.  Have had surgery on their cervix.  Are younger than age 20 or older than age 20 or older than age 20.  Are African American.  Are pregnant with twins or multiple babies (multiple gestation).  Take street drugs or smoke while pregnant.  Do not gain enough weight while pregnant.  Became pregnant shortly after having been pregnant.  What are the symptoms of preterm labor? Symptoms of preterm labor include:  Cramps similar to those that can happen during a menstrual period. The cramps may happen with diarrhea.  Pain in the abdomen or lower back.  Regular uterine contractions that may feel like tightening of the abdomen.  A feeling of increased pressure in the pelvis.  Increased watery or bloody mucus discharge from the vagina.  Water breaking (ruptured amniotic sac).  Why is it important to recognize signs of preterm labor? It is important to recognize signs of preterm labor because babies who are born prematurely may not be fully developed. This can put them at an increased risk for:  Long-term (chronic) heart and lung problems.  Difficulty immediately after birth with regulating body systems, including blood sugar, body temperature, heart rate, and breathing rate.  Bleeding in the brain.  Cerebral palsy.  Learning difficulties.  Death.  These risks are highest for babies who are born before 34 weeks of pregnancy. How is preterm labor treated? Treatment depends on the length of your  pregnancy, your condition, and the health of your baby. It may involve:  Having a stitch (suture) placed in your cervix to prevent your cervix from opening too early (cerclage).  Taking or being given medicines, such as: ? Hormone medicines. These may be given early in pregnancy to help support the pregnancy. ? Medicine to stop contractions. ? Medicines to help mature the baby's lungs. These may be prescribed if the risk of delivery is high. ? Medicines to prevent your baby from developing cerebral palsy.  If the labor happens before 34 weeks of pregnancy, you may need to stay in the hospital. What should I do if I think I am in preterm labor? If you think that you are going into preterm labor, call your health care provider right away. How can I prevent preterm labor in future pregnancies? To increase your chance of having a full-term pregnancy:  Do not use any tobacco products, such as cigarettes, chewing tobacco, and e-cigarettes. If you need help quitting, ask your health care provider.  Do not use street drugs or medicines that have not been prescribed to you during your pregnancy.  Talk with your health care provider before taking any herbal supplements, even if you have been taking them regularly.  Make sure you gain a healthy amount of weight during your pregnancy.  Watch for infection. If you think that you might have an infection, get it checked right away.  Make sure to tell your health care provider if you have gone into preterm labor before.  This information is not intended to replace advice given to you by your health care provider. Make sure you  discuss any questions you have with your health care provider. Document Released: 08/04/2003 Document Revised: 10/25/2015 Document Reviewed: 10/05/2015 Elsevier Interactive Patient Education  2018 [employer]

## 2017-04-26 NOTE — Progress Notes (Signed)
   PRENATAL VISIT NOTE  Subjective:  Shari Skinner is a 21 y.o. G2P0010 at 437w4d being seen today for ongoing prenatal care.  She is currently monitored for the following issues for this high-risk pregnancy and has Encounter for supervision of low-risk pregnancy, antepartum; Nausea and vomiting during pregnancy prior to [redacted] weeks gestation; Anxiety and depression; Abnormal chromosomal and genetic finding on antenatal screening mother; Poor weight gain of pregnancy; Abnormal ultrasonic finding on antenatal screening of mother, antepartum; and MDD (major depressive disorder) on their problem list.  Patient reports no complaints.  Contractions: Not present. Vag. Bleeding: None.  Movement: Present. Denies leaking of fluid.   The following portions of the patient's history were reviewed and updated as appropriate: allergies, current medications, past family history, past medical history, past social history, past surgical history and problem list. Problem list updated.  Objective:   Vitals:   04/26/17 0806  BP: (!) 123/56  Pulse: 78  Weight: 170 lb (77.1 kg)    Fetal Status: Fetal Heart Rate (bpm): 138 Fundal Height: 28 cm Movement: Present  Presentation: Vertex  General:  Alert, oriented and cooperative. Patient is in no acute distress.  Skin: Skin is warm and dry. No rash noted.   Cardiovascular: Normal heart rate noted  Respiratory: Normal respiratory effort, no problems with respiration noted  Abdomen: Soft, gravid, appropriate for gestational age.  Pain/Pressure: Absent     Pelvic: Cervical exam deferred        Extremities: Normal range of motion.  Edema: Trace  Mental Status:  Normal mood and affect. Normal behavior. Normal judgment and thought content.   Assessment and Plan:  Pregnancy: G2P0010 at 737w4d  1. Encounter for supervision of normal pregnancy, antepartum, unspecified gravidity  - HIV antibody (with reflex) - CBC - RPR - 2Hr GTT w/ 1 Hr Carpenter 75 g - Tdap  vaccine greater than or equal to 7yo IM - Antibody screen - rho (d) immune globulin (RHIG/RHOPHYLAC) injection 300 mcg  2. Abnormal chromosomal and genetic finding on antenatal screening mother - F/U US today. 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 Encounter for supervision of low-risk pregnancy, antepartum   Preterm labor symptoms and general obstetric precautions including but not limited to vaginal bleeding, contractions, leaking of fluid and fetal movement were reviewed in detail with the patient. Please refer to After Visit Summary for other counseling recommendations.  Return in about 2 weeks (around 05/10/2017) for ROB.   Dorathy KinsmanVirginia Samarion Ehle, CNM

## 2017-04-29 ENCOUNTER — Telehealth: Payer: Self-pay

## 2017-04-29 LAB — ANTIBODY SCREEN: ANTIBODY SCREEN: NOT DETECTED

## 2017-04-29 LAB — CBC
HEMATOCRIT: 34 % — AB (ref 35.0–45.0)
HEMOGLOBIN: 11.8 g/dL (ref 11.7–15.5)
MCH: 32.2 pg (ref 27.0–33.0)
MCHC: 34.7 g/dL (ref 32.0–36.0)
MCV: 92.6 fL (ref 80.0–100.0)
MPV: 11.4 fL (ref 7.5–12.5)
Platelets: 194 10*3/uL (ref 140–400)
RBC: 3.67 10*6/uL — ABNORMAL LOW (ref 3.80–5.10)
RDW: 11.8 % (ref 11.0–15.0)
WBC: 9.5 10*3/uL (ref 3.8–10.8)

## 2017-04-29 LAB — 2HR GTT W 1 HR, CARPENTER, 75 G
GLUCOSE, 1 HR, GEST: 96 mg/dL (ref 65–179)
Glucose, 2 Hr, Gest: 57 mg/dL — ABNORMAL LOW (ref 65–152)
Glucose, Fasting, Gest: 88 mg/dL (ref 65–91)

## 2017-04-29 LAB — RPR: RPR Ser Ql: NONREACTIVE

## 2017-04-29 LAB — HIV ANTIBODY (ROUTINE TESTING W REFLEX): HIV 1&2 Ab, 4th Generation: NONREACTIVE

## 2017-04-29 NOTE — Telephone Encounter (Signed)
Spoke with pt and she is aware she passed her 2 hour GTT

## 2017-05-08 ENCOUNTER — Telehealth: Payer: Self-pay | Admitting: *Deleted

## 2017-05-08 MED ORDER — PROMETHAZINE HCL 25 MG RE SUPP: 25.0000 mg | Freq: Four times a day (QID) | RECTAL | 0 refills | Status: DC | PRN

## 2017-05-08 NOTE — Telephone Encounter (Signed)
Pt called stating that she cannot keep anything down.  She denies having diarrhea or fever.  She is 29 plus weeks pregnant.  Recommended Phenergan 25 mg supp.  If no improvement in 24 hours she will either come to [facility] or [facility] for fluids and evaluation.  Pt voices understanding.

## 2017-05-09 ENCOUNTER — Emergency Department (INDEPENDENT_AMBULATORY_CARE_PROVIDER_SITE_OTHER)
Admission: EM | Admit: 2017-05-09 | Discharge: 2017-05-09 | Disposition: A | Discharge status: Home / Self Care | Payer: Medicaid Other | Source: Home / Self Care | Attending: Family Medicine | Admitting: Family Medicine

## 2017-05-09 ENCOUNTER — Encounter: Payer: Self-pay | Admitting: Emergency Medicine

## 2017-05-09 DIAGNOSIS — O219 Vomiting of pregnancy, unspecified: Secondary | ICD-10-CM

## 2017-05-09 MED ORDER — PROMETHAZINE HCL 25 MG PO TABS
25.0000 mg | ORAL_TABLET | Freq: Once | ORAL | Status: AC
  Administered 2017-05-09: 25 mg via ORAL

## 2017-05-09 MED ORDER — SODIUM CHLORIDE 0.9 % IV SOLN
Freq: Once | INTRAVENOUS | Status: AC
  Administered 2017-05-09: 10:00:00 via INTRAVENOUS

## 2017-05-09 NOTE — ED Notes (Signed)
Pt feeling better, nausea relieved, headache relieved.

## 2017-05-09 NOTE — ED Triage Notes (Signed)
Pt is pregnant, EDD 07/22/17, c/o N+V x2 days. States she has taken diclegis and zofran with no relief. States this has been a chronic issue for her this pregnancy. Last meal was yesterday afternoon.

## 2017-05-09 NOTE — ED Notes (Signed)
IV discontinued, catheter intact.  No swelling, redness or signs of infiltration noted.  Tolerated procedure well.

## 2017-05-09 NOTE — ED Notes (Signed)
IV started in left antecubital.  1 Stick, no signs of infiltration.  Pt tolerated procedure well.  Dressing clean and dry 22g needle used.  1000 ml 0.9 infusing.

## 2017-05-09 NOTE — ED Provider Notes (Signed)
[facility]    CSN: [id number] Arrival date & time: 05/09/17  0931     History   Chief Complaint Chief Complaint  Patient presents with  . Emesis    HPI Shari Skinner is a 21 y.o. female.   Patient is 29+ weeks pregnant, and complains of recurrent nausea/vomiting for two days.  She was prescribed promethazine suppository yesterday, but her insurance would not cover it, and she is unable to retain oral medication.  She complains of headache, but denies fever/chills, abdominal or pelvic pain, vaginal bleeding, dysuria, URI symptoms.   The history is provided by the patient.  Emesis  Severity:  Moderate Duration:  2 days Timing:  Constant Quality:  Stomach contents Progression:  Unchanged Chronicity:  Recurrent Recent urination:  Decreased Relieved by:  Nothing Worsened by:  Nothing Ineffective treatments: oral promethazine. Associated symptoms: headaches   Associated symptoms: no abdominal pain, no arthralgias, no chills, no cough, no diarrhea, no fever, no myalgias, no sore throat and no URI   Risk factors: pregnant     Past Medical History:  Diagnosis Date  . Anxiety   . Depression     Patient Active Problem List   Diagnosis Date Noted  . Abnormal ultrasonic finding on antenatal screening of mother, antepartum 04/26/2017  . Rh negative, antepartum 04/26/2017  . Anxiety and depression 02/22/2017  . Abnormal chromosomal and genetic finding on antenatal screening mother 02/22/2017  . Poor weight gain of pregnancy 02/22/2017  . Encounter for supervision of low-risk pregnancy, antepartum 01/25/2017  . Nausea and vomiting during pregnancy prior to [redacted] weeks gestation 01/25/2017  . MDD (major depressive disorder) 08/18/2012    Past Surgical History:  Procedure Laterality Date  . APPENDECTOMY  2013  . WISDOM TOOTH EXTRACTION      OB History    Gravida Para Term Preterm AB Living   2       1     SAB TAB Ectopic Multiple Live Births   1                 Home Medications    Prior to Admission medications   Medication Sig Start Date End Date Taking? Authorizing Provider  diphenhydrAMINE (BENADRYL) 25 MG tablet Take 25 mg by mouth every 6 (six) hours as needed.    [provider]  Doxylamine-Pyridoxine (DICLEGIS) 10-10 MG TBEC Take 2 tabs at bedtime. If needed, add another tab in the morning. If needed, add another tab in the afternoon, up to 4 tabs/day. Patient not taking: Reported on 02/25/2017 01/25/17   Leftwich-Kirby, Wilmer FloorLisa A, CNM  metoCLOPramide (REGLAN) 10 MG tablet Take 1 tablet (10 mg total) by mouth every 6 (six) hours as needed for nausea. Patient not taking: Reported on 02/25/2017 02/22/17   Donette LarryBhambri, Melanie, CNM  prenatal vitamin w/FE, FA (PRENATAL 1 + 1) 27-1 MG TABS tablet Take 1 tablet by mouth daily at 12 noon.    [provider]  promethazine (PHENERGAN) 25 MG suppository Place 1 suppository (25 mg total) rectally every 6 (six) hours as needed for nausea or vomiting. 05/08/17   Nicholaus Bloomove, Myra C, MD  sertraline (ZOLOFT) 25 MG tablet Take 1 tablet (25 mg total) by mouth daily. Patient not taking: Reported on 02/25/2017 02/22/17   Donette LarryBhambri, Melanie, CNM    Family History Family History  Problem Relation Age of Onset  . Diabetes Mother   . Diabetes Father     Social History Social History   Tobacco Use  .  Smoking status: Former Smoker    Packs/day: 0.50    Years: 3.00    Pack years: 1.50    Types: Cigarettes    Last attempt to quit: 09/25/2016    Years since quitting: 0.6  . Smokeless tobacco: Never Used  Substance Use Topics  . Alcohol use: No  . Drug use: No     Allergies   Hydrocodone-acetaminophen and Strawberry leaves extract   Review of Systems Review of Systems  Constitutional: Negative for chills and fever.  HENT: Negative for sore throat.   Eyes: Negative.   Respiratory: Negative for cough.   Cardiovascular: Negative for chest pain, palpitations and leg swelling.   Gastrointestinal: Positive for vomiting. Negative for abdominal pain and diarrhea.  Genitourinary: Negative for dysuria, pelvic pain and vaginal bleeding.  Musculoskeletal: Negative for arthralgias and myalgias.  Neurological: Positive for headaches. Negative for weakness and light-headedness.  All other systems reviewed and are negative.    Physical Exam Triage Vital Signs ED Triage Vitals [05/09/17 1000]  Enc Vitals Group     BP 117/72     Pulse Rate 90     Resp      Temp 98 F (36.7 C)     Temp Source Oral     SpO2 98 %     Weight 170 lb (77.1 kg)     Height      Head Circumference      Peak Flow      Pain Score 0     Pain Loc      Pain Edu?      Excl. in GC?    No data found.  Updated Vital Signs BP 117/72 (BP Location: Right Arm)   Pulse 90   Temp 98 F (36.7 C) (Oral)   Wt 170 lb (77.1 kg)   LMP 10/06/2016 (Exact Date)   SpO2 98%   BMI 29.18 kg/m   Visual Acuity Right Eye Distance:   Left Eye Distance:   Bilateral Distance:    Right Eye Near:   Left Eye Near:    Bilateral Near:     Physical Exam Nursing notes and Vital Signs reviewed. Appearance:  Patient appears stated age, and in no acute distress.   She is alert and oriented.   Eyes:  Pupils are equal, round, and reactive to light and accomodation.  Extraocular movement is intact.  Conjunctivae are not inflamed   Pharynx:  Normal; mucous membranes reveal decreased moisture. Neck:  Supple.  No adenopathy Lungs:  Clear to auscultation.  Breath sounds are equal.  Moving air well. Heart:  Regular rate and rhythm without murmurs, rubs, or gallops.  Abdomen:  Gravid. Nontender without masses or hepatosplenomegaly.  Bowel sounds are present.  No CVA or flank tenderness.  Extremities:  No edema.  Skin:  No rash present.     UC Treatments / Results  Labs (all labs ordered are listed, but only abnormal results are displayed) Labs Reviewed - No data to display  EKG  EKG Interpretation None        Radiology No results found.  Procedures Procedures  Administered one liter IV normal saline.  Patient reports resolution of headache and nausea after administration of fluids.  Medications Ordered in UC Medications  promethazine (PHENERGAN) tablet 25 mg (not administered)  0.9 %  sodium chloride infusion ( Intravenous New Bag/Given 05/09/17 1010)     Initial Impression / Assessment and Plan / UC Course  I have reviewed the triage vital signs  and the nursing notes.  Pertinent labs & imaging results that were available during my care of the patient were reviewed by me and considered in my medical decision making (see chart for details).    Administered one liter IV normal saline, followed by promethazine 25mg  PO. May continue Phenergan as needed for nausea. Followup with OBGYN as scheduled. If symptoms become significantly worse during the night or over the weekend, [facility].    Final Clinical Impressions(s) / UC Diagnoses   Final diagnoses:  Nausea and vomiting during pregnancy    ED Discharge Orders    None          Lattie HawBeese, Dywane Peruski A, MD 05/09/17 1110

## 2017-05-09 NOTE — Discharge Instructions (Addendum)
May continue Phenergan as needed for nausea.

## 2017-05-11 ENCOUNTER — Telehealth: Payer: Self-pay | Admitting: Emergency Medicine

## 2017-05-11 NOTE — Telephone Encounter (Signed)
Patient states she is feeling much better. pk

## 2017-05-13 ENCOUNTER — Ambulatory Visit (INDEPENDENT_AMBULATORY_CARE_PROVIDER_SITE_OTHER): Payer: Medicaid Other | Admitting: Advanced Practice Midwife

## 2017-05-13 VITALS — BP 121/79 | HR 107 | Wt 170.0 lb

## 2017-05-13 DIAGNOSIS — O09893 Supervision of other high risk pregnancies, third trimester: Secondary | ICD-10-CM

## 2017-05-13 DIAGNOSIS — Z6791 Unspecified blood type, Rh negative: Secondary | ICD-10-CM

## 2017-05-13 DIAGNOSIS — O365930 Maternal care for other known or suspected poor fetal growth, third trimester, not applicable or unspecified: Secondary | ICD-10-CM

## 2017-05-13 DIAGNOSIS — O365990 Maternal care for other known or suspected poor fetal growth, unspecified trimester, not applicable or unspecified: Secondary | ICD-10-CM

## 2017-05-13 DIAGNOSIS — O285 Abnormal chromosomal and genetic finding on antenatal screening of mother: Secondary | ICD-10-CM

## 2017-05-13 DIAGNOSIS — O2613 Low weight gain in pregnancy, third trimester: Secondary | ICD-10-CM

## 2017-05-13 DIAGNOSIS — Z3490 Encounter for supervision of normal pregnancy, unspecified, unspecified trimester: Secondary | ICD-10-CM

## 2017-05-13 DIAGNOSIS — O26899 Other specified pregnancy related conditions, unspecified trimester: Secondary | ICD-10-CM

## 2017-05-13 DIAGNOSIS — O283 Abnormal ultrasonic finding on antenatal screening of mother: Secondary | ICD-10-CM

## 2017-05-13 NOTE — Progress Notes (Signed)
   PRENATAL VISIT NOTE  Subjective:  Shari Skinner is a 21 y.o. G2P0010 at 7667w0d being seen today for ongoing prenatal care.  She is currently monitored for the following issues for this high-risk pregnancy and has Encounter for supervision of low-risk pregnancy, antepartum; Nausea and vomiting during pregnancy prior to [redacted] weeks gestation; Anxiety and depression; Abnormal chromosomal and genetic finding on antenatal screening mother; Poor weight gain of pregnancy; Abnormal ultrasonic finding on antenatal screening of mother, antepartum; MDD (major depressive disorder); and Rh negative, antepartum on their problem list.  Patient reports occasional contractions.  Contractions: Not present. Vag. Bleeding: None.  Movement: Present. Denies leaking of fluid.   The following portions of the patient's history were reviewed and updated as appropriate: allergies, current medications, past family history, past medical history, past social history, past surgical history and problem list. Problem list updated.  Objective:   Vitals:   05/13/17 1111  BP: 121/79  Pulse: (!) 107  Weight: 170 lb (77.1 kg)    Fetal Status: Fetal Heart Rate (bpm): 145   Movement: Present     General:  Alert, oriented and cooperative. Patient is in no acute distress.  Skin: Skin is warm and dry. No rash noted.   Cardiovascular: Normal heart rate noted  Respiratory: Normal respiratory effort, no problems with respiration noted  Abdomen: Soft, gravid, appropriate for gestational age.  Pain/Pressure: Absent     Pelvic: Cervical exam deferred        Extremities: Normal range of motion.  Edema: Trace  Mental Status:  Normal mood and affect. Normal behavior. Normal judgment and thought content.   Assessment and Plan:  Pregnancy: G2P0010 at 2767w0d  1. Encounter for supervision of low-risk pregnancy, antepartum  - US MFM OB FOLLOW UP; Future  2. Pregnancy affected by fetal growth restriction- Nml interval grwoth  - US  MFM OB FOLLOW UP; Future -   3. 32 week prematurity  - US MFM OB FOLLOW UP; Future  4. Abnormal chromosomal and genetic finding on antenatal screening mother  - US MFM OB FOLLOW UP; Future  5. Abnormal ultrasonic finding on antenatal screening of mother, antepartum  - US MFM OB FOLLOW UP; Future  6. Low weight gain during pregnancy in third trimester  - US MFM OB FOLLOW UP; Future  7. Rh negative, antepartum  - US MFM OB FOLLOW UP; Future  Preterm labor symptoms and general obstetric precautions including but not limited to vaginal bleeding, contractions, leaking of fluid and fetal movement were reviewed in detail with the patient. Please refer to After Visit Summary for other counseling recommendations.  Return in about 2 weeks (around 05/27/2017) for ROB.   Dorathy KinsmanVirginia Annica Marinello, CNM

## 2017-05-13 NOTE — Patient Instructions (Addendum)
Clydie BraunKaren Teague-Clark Headache specialist Jerline Pain([place])   Ball CorporationBraxton Hicks Contractions Contractions of the uterus can occur throughout pregnancy, but they are not always a sign that you are in labor. You may have practice contractions called Braxton Hicks contractions. These false labor contractions are sometimes confused with true labor. What are Deberah PeltonBraxton Hicks contractions? Braxton Hicks contractions are tightening movements that occur in the muscles of the uterus before labor. Unlike true labor contractions, these contractions do not result in opening (dilation) and thinning of the cervix. Toward the end of pregnancy (32-34 weeks), Braxton Hicks contractions can happen more often and may become stronger. These contractions are sometimes difficult to tell apart from true labor because they can be very uncomfortable. You should not feel embarrassed if you go to the hospital with false labor. Sometimes, the only way to tell if you are in true labor is for your health care provider to look for changes in the cervix. The health care provider will do a physical exam and may monitor your contractions. If you are not in true labor, the exam should show that your cervix is not dilating and your water has not broken. If there are no prenatal problems or other health problems associated with your pregnancy, it is completely safe for you to be sent home with false labor. You may continue to have Braxton Hicks contractions until you go into true labor. How can I tell the difference between true labor and false labor?  Differences ? False labor ? Contractions last 30-70 seconds.: Contractions are usually shorter and not as strong as true labor contractions. ? Contractions become very regular.: Contractions are usually irregular. ? Discomfort is usually felt in the top of the uterus, and it spreads to the lower abdomen and low back.: Contractions are often felt in the front of the lower abdomen and in the  groin. ? Contractions do not go away with walking.: Contractions may go away when you walk around or change positions while lying down. ? Contractions usually become more intense and increase in frequency.: Contractions get weaker and are shorter-lasting as time goes on. ? The cervix dilates and gets thinner.: The cervix usually does not dilate or become thin. Follow these instructions at home:  Take over-the-counter and prescription medicines only as told by your health care provider.  Keep up with your usual exercises and follow other instructions from your health care provider.  Eat and drink lightly if you think you are going into labor.  If Braxton Hicks contractions are making you uncomfortable: ? Change your position from lying down or resting to walking, or change from walking to resting. ? Sit and rest in a tub of warm water. ? Drink enough fluid to keep your urine clear or pale yellow. Dehydration may cause these contractions. ? Do slow and deep breathing several times an hour.  Keep all follow-up prenatal visits as told by your health care provider. This is important. Contact a health care provider if:  You have a fever.  You have continuous pain in your abdomen. Get help right away if:  Your contractions become stronger, more regular, and closer together.  You have fluid leaking or gushing from your vagina.  You pass blood-tinged mucus (bloody show).  You have bleeding from your vagina.  You have low back pain that you never had before.  You feel your baby's head pushing down and causing pelvic pressure.  Your baby is not moving inside you as much as it used to.  Summary  Contractions that occur before labor are called Braxton Hicks contractions, false labor, or practice contractions.  Braxton Hicks contractions are usually shorter, weaker, farther apart, and less regular than true labor contractions. True labor contractions usually become progressively stronger  and regular and they become more frequent.  Manage discomfort from [facility] Center[facility] Hicks contractions by changing position, resting in a warm bath, drinking plenty of water, or practicing deep breathing. This information is not intended to replace advice given to you by your health care provider. Make sure you discuss any questions you have with your health care provider. Document Released: 05/14/2005 Document Revised: 04/02/2016 Document Reviewed: 04/02/2016 Elsevier Interactive Patient Education  2017 [employer]

## 2017-05-24 ENCOUNTER — Ambulatory Visit (INDEPENDENT_AMBULATORY_CARE_PROVIDER_SITE_OTHER): Payer: Medicaid Other | Admitting: Certified Nurse Midwife

## 2017-05-24 ENCOUNTER — Ambulatory Visit (HOSPITAL_COMMUNITY)
Admission: RE | Admit: 2017-05-24 | Discharge: 2017-05-24 | Disposition: A | Discharge status: Home / Self Care | Payer: Medicaid Other | Source: Ambulatory Visit | Attending: Advanced Practice Midwife | Admitting: Advanced Practice Midwife

## 2017-05-24 VITALS — BP 107/65 | HR 80 | Wt 176.0 lb

## 2017-05-24 DIAGNOSIS — O283 Abnormal ultrasonic finding on antenatal screening of mother: Secondary | ICD-10-CM | POA: Diagnosis not present

## 2017-05-24 DIAGNOSIS — O365990 Maternal care for other known or suspected poor fetal growth, unspecified trimester, not applicable or unspecified: Secondary | ICD-10-CM | POA: Diagnosis not present

## 2017-05-24 DIAGNOSIS — Z3A31 31 weeks gestation of pregnancy: Secondary | ICD-10-CM | POA: Insufficient documentation

## 2017-05-24 DIAGNOSIS — O26899 Other specified pregnancy related conditions, unspecified trimester: Secondary | ICD-10-CM

## 2017-05-24 DIAGNOSIS — Z3490 Encounter for supervision of normal pregnancy, unspecified, unspecified trimester: Secondary | ICD-10-CM | POA: Diagnosis present

## 2017-05-24 DIAGNOSIS — O2613 Low weight gain in pregnancy, third trimester: Secondary | ICD-10-CM | POA: Diagnosis not present

## 2017-05-24 DIAGNOSIS — O285 Abnormal chromosomal and genetic finding on antenatal screening of mother: Secondary | ICD-10-CM | POA: Diagnosis not present

## 2017-05-24 DIAGNOSIS — Z6791 Unspecified blood type, Rh negative: Secondary | ICD-10-CM

## 2017-05-24 DIAGNOSIS — O219 Vomiting of pregnancy, unspecified: Secondary | ICD-10-CM | POA: Insufficient documentation

## 2017-05-24 DIAGNOSIS — F329 Major depressive disorder, single episode, unspecified: Secondary | ICD-10-CM

## 2017-05-24 DIAGNOSIS — F419 Anxiety disorder, unspecified: Secondary | ICD-10-CM

## 2017-05-24 DIAGNOSIS — F32A Depression, unspecified: Secondary | ICD-10-CM

## 2017-05-24 NOTE — Progress Notes (Signed)
Subjective:  Tommye Standardmanda Pietrzak is a 21 y.o. G2P0010 at 5560w4d being seen today for ongoing prenatal care.  She is currently monitored for the following issues for this low-risk pregnancy and has Encounter for supervision of low-risk pregnancy, antepartum; Nausea and vomiting during pregnancy prior to [redacted] weeks gestation; Anxiety and depression; Abnormal chromosomal and genetic finding on antenatal screening mother; Poor weight gain of pregnancy; Abnormal ultrasonic finding on antenatal screening of mother, antepartum; MDD (major depressive disorder); Rh negative, antepartum; and Nausea and vomiting during pregnancy on their problem list.  Patient reports no complaints.  Contractions: Not present. Vag. Bleeding: None.  Movement: Present. Denies leaking of fluid.   The following portions of the patient's history were reviewed and updated as appropriate: allergies, current medications, past family history, past medical history, past social history, past surgical history and problem list. Problem list updated.  Objective:   Vitals:   05/24/17 1012  BP: 107/65  Pulse: 80  Weight: 176 lb (79.8 kg)    Fetal Status: Fetal Heart Rate (bpm): 147 Fundal Height: 29 cm Movement: Present  Presentation: Undeterminable  General:  Alert, oriented and cooperative. Patient is in no acute distress.  Skin: Skin is warm and dry. No rash noted.   Cardiovascular: Normal heart rate noted  Respiratory: Normal respiratory effort, no problems with respiration noted  Abdomen: Soft, gravid, appropriate for gestational age. Pain/Pressure: Absent     Pelvic: Vag. Bleeding: None Vag D/C Character: Thin   Cervical exam deferred        Extremities: Normal range of motion.  Edema: Trace  Mental Status: Normal mood and affect. Normal behavior. Normal judgment and thought content.   Urinalysis: Urine Protein: Trace Urine Glucose: Negative  Assessment and Plan:  Pregnancy: G2P0010 at 2760w4d  1. Encounter for supervision of  low-risk pregnancy, antepartum  2. Anxiety and depression - took Zoloft for 2 days, felt less FM and stopped taking - states managing anxiety w/o meds - discussed starting prior to birth or immediately postpartum if desires to aide postpartum transition  3. Nausea and vomiting during pregnancy - uses Diclegis as hs and Reglan prn - much improved - gaining weight  4. Abnormal fetal US - short long bones, most likely constitutional - f/u US today, scheduled  Preterm labor symptoms and general obstetric precautions including but not limited to vaginal bleeding, contractions, leaking of fluid and fetal movement were reviewed in detail with the patient. Please refer to After Visit Summary for other counseling recommendations.  Return in about 2 weeks (around 06/07/2017).   Donette LarryBhambri, Levone Otten, CNM

## 2017-05-24 NOTE — Patient Instructions (Signed)
Braxton Hicks Contractions °Contractions of the uterus can occur throughout pregnancy, but they are not always a sign that you are in labor. You may have practice contractions called Braxton Hicks contractions. These false labor contractions are sometimes confused with true labor. °What are Braxton Hicks contractions? °Braxton Hicks contractions are tightening movements that occur in the muscles of the uterus before labor. Unlike true labor contractions, these contractions do not result in opening (dilation) and thinning of the cervix. Toward the end of pregnancy (32-34 weeks), Braxton Hicks contractions can happen more often and may become stronger. These contractions are sometimes difficult to tell apart from true labor because they can be very uncomfortable. You should not feel embarrassed if you go to the hospital with false labor. °Sometimes, the only way to tell if you are in true labor is for your health care provider to look for changes in the cervix. The health care provider will do a physical exam and may monitor your contractions. If you are not in true labor, the exam should show that your cervix is not dilating and your water has not broken. °If there are other health problems associated with your pregnancy, it is completely safe for you to be sent home with false labor. You may continue to have Braxton Hicks contractions until you go into true labor. °How to tell the difference between true labor and false labor °True labor °· Contractions last 30-70 seconds. °· Contractions become very regular. °· Discomfort is usually felt in the top of the uterus, and it spreads to the lower abdomen and low back. °· Contractions do not go away with walking. °· Contractions usually become more intense and increase in frequency. °· The cervix dilates and gets thinner. °False labor °· Contractions are usually shorter and not as strong as true labor contractions. °· Contractions are usually irregular. °· Contractions  are often felt in the front of the lower abdomen and in the groin. °· Contractions may go away when you walk around or change positions while lying down. °· Contractions get weaker and are shorter-lasting as time goes on. °· The cervix usually does not dilate or become thin. °Follow these instructions at home: °· Take over-the-counter and prescription medicines only as told by your health care provider. °· Keep up with your usual exercises and follow other instructions from your health care provider. °· Eat and drink lightly if you think you are going into labor. °· If Braxton Hicks contractions are making you uncomfortable: °? Change your position from lying down or resting to walking, or change from walking to resting. °? Sit and rest in a tub of warm water. °? Drink enough fluid to keep your urine pale yellow. Dehydration may cause these contractions. °? Do slow and deep breathing several times an hour. °· Keep all follow-up prenatal visits as told by your health care provider. This is important. °Contact a health care provider if: °· You have a fever. °· You have continuous pain in your abdomen. °Get help right away if: °· Your contractions become stronger, more regular, and closer together. °· You have fluid leaking or gushing from your vagina. °· You pass blood-tinged mucus (bloody show). °· You have bleeding from your vagina. °· You have low back pain that you never had before. °· You feel your baby’s head pushing down and causing pelvic pressure. °· Your baby is not moving inside you as much as it used to. °Summary °· Contractions that occur before labor are called Braxton   Hicks contractions, false labor, or practice contractions. °· Braxton Hicks contractions are usually shorter, weaker, farther apart, and less regular than true labor contractions. True labor contractions usually become progressively stronger and regular and they become more frequent. °· Manage discomfort from Braxton Hicks contractions by  changing position, resting in a warm bath, drinking plenty of water, or practicing deep breathing. °This information is not intended to replace advice given to you by your health care provider. Make sure you discuss any questions you have with your health care provider. °Document Released: 09/27/2016 Document Revised: 09/27/2016 Document Reviewed: 09/27/2016 °Elsevier Interactive Patient Education © 2018 [employer] ° °Third Trimester of Pregnancy °The third trimester is from week 29 through week 42, months 7 through 9. This trimester is when your unborn baby (fetus) is growing very fast. At the end of the ninth month, the unborn baby is about 20 inches in length. It weighs about 6-10 pounds. °Follow these instructions at home: °· Avoid all smoking, herbs, and alcohol. Avoid drugs not approved by your doctor. °· Do not use any tobacco products, including cigarettes, chewing tobacco, and electronic cigarettes. If you need help quitting, ask your doctor. You may get counseling or other support to help you quit. °· Only take medicine as told by your doctor. Some medicines are safe and some are not during pregnancy. °· Exercise only as told by your doctor. Stop exercising if you start having cramps. °· Eat regular, healthy meals. °· Wear a good support bra if your breasts are tender. °· Do not use hot tubs, steam rooms, or saunas. °· Wear your seat belt when driving. °· Avoid raw meat, uncooked cheese, and liter boxes and soil used by cats. °· Take your prenatal vitamins. °· Take 1500-2000 milligrams of calcium daily starting at the 20th week of pregnancy until you deliver your baby. °· Try taking medicine that helps you poop (stool softener) as needed, and if your doctor approves. Eat more fiber by eating fresh fruit, vegetables, and whole grains. Drink enough fluids to keep your pee (urine) clear or pale yellow. °· Take warm water baths (sitz baths) to soothe pain or discomfort caused by hemorrhoids. Use hemorrhoid  cream if your doctor approves. °· If you have puffy, bulging veins (varicose veins), wear support hose. Raise (elevate) your feet for 15 minutes, 3-4 times a day. Limit salt in your diet. °· Avoid heavy lifting, wear low heels, and sit up straight. °· Rest with your legs raised if you have leg cramps or low back pain. °· Visit your dentist if you have not gone during your pregnancy. Use a soft toothbrush to brush your teeth. Be gentle when you floss. °· You can have sex (intercourse) unless your doctor tells you not to. °· Do not travel far distances unless you must. Only do so with your doctor's approval. °· Take prenatal classes. °· Practice driving to the hospital. °· Pack your hospital bag. °· Prepare the baby's room. °· Go to your doctor visits. °Get help if: °· You are not sure if you are in labor or if your water has broken. °· You are dizzy. °· You have mild cramps or pressure in your lower belly (abdominal). °· You have a nagging pain in your belly area. °· You continue to feel sick to your stomach (nauseous), throw up (vomit), or have watery poop (diarrhea). °· You have bad smelling fluid coming from your vagina. °· You have pain with peeing (urination). °Get help right away if: °·   You have a fever. °· You are leaking fluid from your vagina. °· You are spotting or bleeding from your vagina. °· You have severe belly cramping or pain. °· You lose or gain weight rapidly. °· You have trouble catching your breath and have chest pain. °· You notice sudden or extreme puffiness (swelling) of your face, hands, ankles, feet, or legs. °· You have not felt the baby move in over an hour. °· You have severe headaches that do not go away with medicine. °· You have vision changes. °This information is not intended to replace advice given to you by your health care provider. Make sure you discuss any questions you have with your health care provider. °Document Released: 08/08/2009 Document Revised: 10/20/2015 Document  Reviewed: 07/15/2012 °Elsevier Interactive Patient Education © 2017 [employer] ° °

## 2017-05-28 NOTE — L&D Delivery Note (Signed)
Delivery Note At 3:53 PM a viable female was delivered via Vaginal, Spontaneous (Presentation: vertex;  LOA).  APGAR: 9, 9; weight pending  .   Placenta status: intact delivery, .  Cord:3 Vessel, nuchal and cord around left foot   with the following complications:none  .  Cord pH: n/A  Anesthesia:  epidural Episiotomy: None Lacerations: perineal   Suture Repair: 2.0 Est. Blood Loss (mL):  450  Mom to postpartum.  Baby to Couplet care / Skin to Skin.  Keilany Burnette 07/02/2017, 4:26 PM

## 2017-06-04 ENCOUNTER — Other Ambulatory Visit: Payer: Self-pay

## 2017-06-04 ENCOUNTER — Inpatient Hospital Stay (HOSPITAL_COMMUNITY)
Admission: AD | Admit: 2017-06-04 | Discharge: 2017-06-04 | Disposition: A | Discharge status: Home / Self Care | Payer: Medicaid Other | Source: Ambulatory Visit | Attending: Obstetrics and Gynecology | Admitting: Obstetrics and Gynecology

## 2017-06-04 ENCOUNTER — Encounter (HOSPITAL_COMMUNITY): Payer: Self-pay

## 2017-06-04 DIAGNOSIS — O283 Abnormal ultrasonic finding on antenatal screening of mother: Secondary | ICD-10-CM

## 2017-06-04 DIAGNOSIS — O285 Abnormal chromosomal and genetic finding on antenatal screening of mother: Secondary | ICD-10-CM | POA: Diagnosis not present

## 2017-06-04 DIAGNOSIS — O219 Vomiting of pregnancy, unspecified: Secondary | ICD-10-CM | POA: Diagnosis not present

## 2017-06-04 DIAGNOSIS — Z3403 Encounter for supervision of normal first pregnancy, third trimester: Secondary | ICD-10-CM | POA: Diagnosis not present

## 2017-06-04 DIAGNOSIS — F329 Major depressive disorder, single episode, unspecified: Secondary | ICD-10-CM

## 2017-06-04 DIAGNOSIS — F32A Depression, unspecified: Secondary | ICD-10-CM

## 2017-06-04 DIAGNOSIS — N5089 Other specified disorders of the male genital organs: Secondary | ICD-10-CM | POA: Diagnosis present

## 2017-06-04 DIAGNOSIS — F419 Anxiety disorder, unspecified: Secondary | ICD-10-CM

## 2017-06-04 DIAGNOSIS — Z3A33 33 weeks gestation of pregnancy: Secondary | ICD-10-CM | POA: Insufficient documentation

## 2017-06-04 LAB — URINALYSIS, ROUTINE W REFLEX MICROSCOPIC
BILIRUBIN URINE: NEGATIVE
Glucose, UA: NEGATIVE mg/dL
HGB URINE DIPSTICK: NEGATIVE
Ketones, ur: NEGATIVE mg/dL
NITRITE: NEGATIVE
PH: 6 (ref 5.0–8.0)
Protein, ur: NEGATIVE mg/dL
SPECIFIC GRAVITY, URINE: 1.017 (ref 1.005–1.030)

## 2017-06-04 LAB — WET PREP, GENITAL
Clue Cells Wet Prep HPF POC: NONE SEEN
SPERM: NONE SEEN
Trich, Wet Prep: NONE SEEN
Yeast Wet Prep HPF POC: NONE SEEN

## 2017-06-04 LAB — POCT FERN TEST: POCT FERN TEST: NEGATIVE

## 2017-06-04 NOTE — MAU Note (Signed)
No ferning seen on slide, sperm noted on slide.

## 2017-06-04 NOTE — MAU Note (Signed)
Pt presents with c/o LOF since 0700 this morning.  States fluid is clear.  Denies VB, reports mild abdominal cramping.  Reports +FM.

## 2017-06-04 NOTE — MAU Provider Note (Signed)
Chief Complaint:  Rupture of Membranes  HPI: Shari Skinner is a 22 y.o. G2P0010 at [redacted]w[redacted]d who presents to maternity admissions reporting leakage of fluid.  This morning she woke up to use the restroom, finished, and then noticed fluid "trickling" down her leg.  Fluid is clear, is distinctly different from the color of her urine, and did not have an odor. When asked, patient reports that she did have vaginal intercourse last night.   She reports good fetal movement, vaginal bleeding, vaginal itching/burning, urinary symptoms, h/a, dizziness, n/v, diarrhea, constipation or fever/chills.  She denies headache, visual changes or RUQ abdominal pain.   Past Medical History: Past Medical History:  Diagnosis Date  . Anxiety   . Depression     Past obstetric history: OB History  Gravida Para Term Preterm AB Living  2       1    SAB TAB Ectopic Multiple Live Births  1            # Outcome Date GA Lbr Len/2nd Weight Sex Delivery Anes PTL Lv  2 Current           1 SAB 2014              Past Surgical History: Past Surgical History:  Procedure Laterality Date  . APPENDECTOMY  2013  . WISDOM TOOTH EXTRACTION      Family History: Family History  Problem Relation Age of Onset  . Diabetes Mother   . Diabetes Father     Social History: Social History   Tobacco Use  . Smoking status: Former Smoker    Packs/day: 0.50    Years: 3.00    Pack years: 1.50    Types: Cigarettes    Last attempt to quit: 09/25/2016    Years since quitting: 0.6  . Smokeless tobacco: Never Used  Substance Use Topics  . Alcohol use: No  . Drug use: No    Allergies:  Allergies  Allergen Reactions  . Amoxicillin Other (See Comments)    Not sure of reaction- maybe a rash- childhood allergy Has patient had a PCN reaction causing immediate rash, facial/tongue/throat swelling, SOB or lightheadedness with hypotension: Unknown Has patient had a PCN reaction causing severe rash involving mucus membranes or skin  necrosis: Unknown Has patient had a PCN reaction that required hospitalization: Unknown Has patient had a PCN reaction occurring within the last 10 years: No If all of the above answers are "NO", then may proceed with Cephalospor  . Clindamycin/Lincomycin Other (See Comments)    Not sure of reaction- maybe rash  . Hydrocodone-Acetaminophen Rash  . Strawberry Leaves Extract Rash    Fruit (strawberry)    Meds:  Medications Prior to Admission  Medication Sig Dispense Refill Last Dose  . calcium carbonate (TUMS - DOSED IN MG ELEMENTAL CALCIUM) 500 MG chewable tablet Chew 1 tablet by mouth 2 (two) times daily as needed for indigestion or heartburn.   Past Week at Unknown time  . Doxylamine-Pyridoxine (DICLEGIS) 10-10 MG TBEC Take 2 tabs at bedtime. If needed, add another tab in the morning. If needed, add another tab in the afternoon, up to 4 tabs/day. 100 tablet 5 06/03/2017 at Unknown time  . metoCLOPramide (REGLAN) 10 MG tablet Take 1 tablet (10 mg total) by mouth every 6 (six) hours as needed for nausea. 30 tablet 1 prn  . prenatal vitamin w/FE, FA (PRENATAL 1 + 1) 27-1 MG TABS tablet Take 1 tablet by mouth daily at 12 noon.  06/03/2017 at Unknown time  . pseudoephedrine (SUDAFED) 30 MG tablet Take 30 mg by mouth every 4 (four) hours as needed for congestion.   Past Week at Unknown time  . diphenhydrAMINE (BENADRYL) 25 MG tablet Take 25 mg by mouth every 6 (six) hours as needed for allergies.    prn  . promethazine (PHENERGAN) 25 MG suppository Place 1 suppository (25 mg total) rectally every 6 (six) hours as needed for nausea or vomiting. 12 each 0 Taking  . sertraline (ZOLOFT) 25 MG tablet Take 1 tablet (25 mg total) by mouth daily. 30 tablet 1 Taking    I have reviewed patient's Past Medical Hx, Surgical Hx, Family Hx, Social Hx, medications and allergies.   ROS: Other systems negative  Physical Exam   Patient Vitals for the past 24 hrs:  BP Pulse Resp SpO2 Height Weight  06/04/17 1033  - - - 99 % - -  06/04/17 1028 - - - 99 % - -  06/04/17 0917 109/75 87 20 100 % 5\' 3"  (1.6 m) 80.2 kg (176 lb 12 oz)   Constitutional: Well-developed, well-nourished female in no acute distress.  Cardiovascular: normal rate and rhythm Respiratory: normal effort, clear to auscultation bilaterally GI: Abd soft, non-tender, gravid appropriate for gestational age.   No rebound or guarding. MS: Extremities nontender, no edema, normal ROM Neurologic: Alert and oriented x 4.  GU: Neg CVAT.  PELVIC EXAM: Cervix pink, visually closed, without lesion, white creamy discharge, vaginal walls and external genitalia normal  FHT:  Baseline 140, moderate variability, accelerations present, no decelerations Contractions: not present   Labs: Results for orders placed or performed during the hospital encounter of 06/04/17 (from the past 24 hour(s))  Urinalysis, Routine w reflex microscopic     Status: Abnormal   Collection Time: 06/04/17  9:20 AM  Result Value Ref Range   Color, Urine YELLOW YELLOW   APPearance HAZY (A) CLEAR   Specific Gravity, Urine 1.017 1.005 - 1.030   pH 6.0 5.0 - 8.0   Glucose, UA NEGATIVE NEGATIVE mg/dL   Hgb urine dipstick NEGATIVE NEGATIVE   Bilirubin Urine NEGATIVE NEGATIVE   Ketones, ur NEGATIVE NEGATIVE mg/dL   Protein, ur NEGATIVE NEGATIVE mg/dL   Nitrite NEGATIVE NEGATIVE   Leukocytes, UA TRACE (A) NEGATIVE   RBC / HPF 0-5 0 - 5 RBC/hpf   WBC, UA 0-5 0 - 5 WBC/hpf   Bacteria, UA RARE (A) NONE SEEN   Squamous Epithelial / LPF 0-5 (A) NONE SEEN   Mucus PRESENT    Sperm, UA PRESENT   Wet prep, genital     Status: Abnormal   Collection Time: 06/04/17 11:05 AM  Result Value Ref Range   Yeast Wet Prep HPF POC NONE SEEN NONE SEEN   Trich, Wet Prep NONE SEEN NONE SEEN   Clue Cells Wet Prep HPF POC NONE SEEN NONE SEEN   WBC, Wet Prep HPF POC MANY (A) NONE SEEN   Sperm NONE SEEN   Fern Test     Status: None   Collection Time: 06/04/17 11:18 AM  Result Value Ref Range    POCT Fern Test Negative = intact amniotic membranes    O/NEG/-- (08/31 1103)  Imaging:  [place] Mfm Ob Follow Up  Result Date: 05/24/2017 ----------------------------------------------------------------------  OBSTETRICS REPORT                      (Signed Final 05/24/2017 04:18 pm) ---------------------------------------------------------------------- Patient Info  ID #:  [id number]                          D.O.B.:  [birth year] (21 yrs)  Name:       JANYLA BISCOE                Visit Date: 05/24/2017 03:36 pm ---------------------------------------------------------------------- Performed By  Performed By:     Percell Boston          Ref. Address:     [address]                                                             [place], [place]                                                             [address]  Attending:        Durwin Nora       Location:         [facility]                    MD  Referred By:      Allie Bossier MD ---------------------------------------------------------------------- Orders   #  Description                                 Code   1  [place] MFM OB FOLLOW UP                         [phone]  ----------------------------------------------------------------------   #  Ordered By               Order #        Accession #    Episode #   1  Dorathy Kinsman           [id number]      [id number]     [id number]  ---------------------------------------------------------------------- Indications   [redacted] weeks gestation of pregnancy                Z3A.31   Abnormal biochemical screen (quad) for         O28.9   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 Cord Insertion:  Visualized, central  Amniotic Fluid  AFI FV:      Subjectively within normal limits  AFI Sum(cm)     %Tile       Largest Pocket(cm)  14.02           47          4.07  RUQ(cm)       RLQ(cm)       LUQ(cm)        LLQ(cm)  2.92          3.44          3.59           4.07 ---------------------------------------------------------------------- Biometry  BPD:      74.4  mm     G. Age:  29w 6d          5  %    CI:        72.65   %    70 - 86                                                          FL/HC:      18.4   %    19.1 - 21.3  HC:      277.6  mm     G. Age:  30w 3d        < 3  %    HC/AC:      1.06        0.96 - 1.17  AC:      260.7  mm     G. Age:  30w 1d         14  %    FL/BPD:     68.5   %    71 - 87  FL:         51  mm     G. Age:  27w 2d        < 3  %    FL/AC:      19.6   %    20 - 24  HUM:      47.8  mm     G. Age:  28w 0d        < 5  %  Est. FW:    1365  gm           3 lb     17  % ---------------------------------------------------------------------- Gestational Age  U/S Today:     29w 3d                                        EDD:   08/06/17  Best:          31w 4d     Det. ByMarcella Dubs         EDD:   07/22/17                                      (12/23/16) ---------------------------------------------------------------------- Anatomy  Cranium:               Appears normal         Aortic Arch:            Previously seen  Cavum:                 Previously seen        Ductal Arch:            Previously seen  Ventricles:            Appears normal         Diaphragm:              Previously seen  Choroid Plexus:        Previously seen        Stomach:                Appears normal, left                                                                         sided  Cerebellum:            Previously seen        Abdomen:                Previously seen  Posterior Fossa:       Previously seen        Abdominal Wall:         Previously seen  Nuchal Fold:           Previously seen        Cord Vessels:           Previously seen  Face:                  Orbits and profile     Kidneys:                Appear normal                         previously seen  Lips:                  Previously seen        Bladder:                Appears normal  Thoracic:              Appears normal         Spine:                  Previously seen  Heart:                 Appears normal  Upper Extremities:      Previously seen                         (4CH, axis, and situs  RVOT:                  Appears normal         Lower Extremities:      Previously seen  LVOT:                  Appears normal  Other:  Heels and 5th digit previously visualized.  Technically difficult due to          fetal position. ---------------------------------------------------------------------- Cervix Uterus Adnexa  Cervix  Not visualized (advanced GA >29wks)  Uterus  No abnormality visualized.  Left Ovary  No adnexal mass visualized.  Right Ovary  No adnexal mass visualized.  Cul De Sac:   No free fluid seen.  Adnexa:       No abnormality visualized. ---------------------------------------------------------------------- Impression  SIUP at [redacted]w[redacted]d  active fetus  EFW 17th%'le  no defects seen  normal AFI  no previa ---------------------------------------------------------------------- Recommendations  Findings of today's ultrasound reviewed. 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  Recommend follow-up ultrasound examination in 4 weeks for  reassessment of fetal growth. ----------------------------------------------------------------------               Durwin Nora, MD Electronically Signed Final Report   05/24/2017 04:18 pm  ----------------------------------------------------------------------   MAU Course/MDM: I have ordered labs and reviewed results.  NST reviewed  Pelvic exam revealed moderate white milky discharge.  Fern test was negative, but 2-6 sperm per high power field were observed.  Discussed with patient the physiology pregnancy and leakage of seminal fluid.   Assessment: Leaking seminal fluid   Plan: Discharge home Labor precautions and fetal kick counts Follow up in Office for prenatal visits and recheck Friday 06/07/17.  Discharge home Patient verbalized an understanding of the plan of care and agrees.     I confirm that I have verified the information documented in the medical student's note and that I have also personally reperformed the physical exam and all medical decision making activities.   Raelyn Mora, CNM  06/04/2017 1:59 PM

## 2017-06-07 ENCOUNTER — Ambulatory Visit (INDEPENDENT_AMBULATORY_CARE_PROVIDER_SITE_OTHER): Payer: Medicaid Other | Admitting: Certified Nurse Midwife

## 2017-06-07 VITALS — BP 110/66 | HR 84 | Wt 176.0 lb

## 2017-06-07 DIAGNOSIS — Z3490 Encounter for supervision of normal pregnancy, unspecified, unspecified trimester: Secondary | ICD-10-CM

## 2017-06-07 DIAGNOSIS — F419 Anxiety disorder, unspecified: Secondary | ICD-10-CM

## 2017-06-07 DIAGNOSIS — F32A Depression, unspecified: Secondary | ICD-10-CM

## 2017-06-07 DIAGNOSIS — F329 Major depressive disorder, single episode, unspecified: Secondary | ICD-10-CM

## 2017-06-07 NOTE — Progress Notes (Signed)
Subjective:  Shari Skinner is a 22 y.o. G2P0010 at 2752w4d being seen today for ongoing prenatal care.  She is currently monitored for the following issues for this low-risk pregnancy and has Encounter for supervision of low-risk pregnancy, antepartum; Nausea and vomiting during pregnancy prior to [redacted] weeks gestation; Anxiety and depression; Abnormal chromosomal and genetic finding on antenatal screening mother; Poor weight gain of pregnancy; Abnormal ultrasonic finding on antenatal screening of mother, antepartum; MDD (major depressive disorder); Rh negative, antepartum; Nausea and vomiting during pregnancy; and Leaking seminal fluid on their problem list.  Patient reports backache and pelvic pressure.  Contractions: Not present. Vag. Bleeding: None.  Movement: Present. Denies leaking of fluid.   The following portions of the patient's history were reviewed and updated as appropriate: allergies, current medications, past family history, past medical history, past social history, past surgical history and problem list. Problem list updated.  Objective:   Vitals:   06/07/17 1111  BP: 110/66  Pulse: 84  Weight: 79.8 kg (176 lb)    Fetal Status: Fetal Heart Rate (bpm): 147 Fundal Height: 32 cm Movement: Present     General:  Alert, oriented and cooperative. Patient is in no acute distress.  Skin: Skin is warm and dry. No rash noted.   Cardiovascular: Normal heart rate noted  Respiratory: Normal respiratory effort, no problems with respiration noted  Abdomen: Soft, gravid, appropriate for gestational age. Pain/Pressure: Present     Pelvic: Vag. Bleeding: None Vag D/C Character: Thin   Cervical exam deferred        Extremities: Normal range of motion.  Edema: Trace  Mental Status: Normal mood and affect. Normal behavior. Normal judgment and thought content.   Urinalysis: Urine Protein: Trace Urine Glucose: Negative  Assessment and Plan:  Pregnancy: G2P0010 at 7752w4d  1. Encounter for  supervision of low-risk pregnancy, antepartum - Rx maternity belt - short long bones, 31 wks> EFW 17 %ile, nml AFV - US MFM OB FOLLOW UP; Future  2. Anxiety and depression - given Rx for Zoloft in 01/2017, took once and stopped - anxiety worsening - plans to start Zoloft this week  Preterm labor symptoms and general obstetric precautions including but not limited to vaginal bleeding, contractions, leaking of fluid and fetal movement were reviewed in detail with the patient. Please refer to After Visit Summary for other counseling recommendations.  Return in about 2 weeks (around 06/21/2017).   Donette LarryBhambri, Amandamarie Feggins, CNM

## 2017-06-21 ENCOUNTER — Ambulatory Visit (HOSPITAL_COMMUNITY)
Admission: RE | Admit: 2017-06-21 | Discharge: 2017-06-21 | Disposition: A | Discharge status: Home / Self Care | Payer: Medicaid Other | Source: Ambulatory Visit | Attending: Certified Nurse Midwife | Admitting: Certified Nurse Midwife

## 2017-06-21 ENCOUNTER — Encounter: Payer: Self-pay | Admitting: Advanced Practice Midwife

## 2017-06-21 ENCOUNTER — Other Ambulatory Visit: Payer: Self-pay | Admitting: Certified Nurse Midwife

## 2017-06-21 ENCOUNTER — Ambulatory Visit (INDEPENDENT_AMBULATORY_CARE_PROVIDER_SITE_OTHER): Payer: Medicaid Other | Admitting: Advanced Practice Midwife

## 2017-06-21 VITALS — BP 119/74 | HR 86 | Wt 179.0 lb

## 2017-06-21 DIAGNOSIS — Z3A35 35 weeks gestation of pregnancy: Secondary | ICD-10-CM | POA: Insufficient documentation

## 2017-06-21 DIAGNOSIS — O283 Abnormal ultrasonic finding on antenatal screening of mother: Secondary | ICD-10-CM

## 2017-06-21 DIAGNOSIS — O289 Unspecified abnormal findings on antenatal screening of mother: Secondary | ICD-10-CM | POA: Diagnosis not present

## 2017-06-21 DIAGNOSIS — O365990 Maternal care for other known or suspected poor fetal growth, unspecified trimester, not applicable or unspecified: Secondary | ICD-10-CM | POA: Diagnosis present

## 2017-06-21 DIAGNOSIS — H53413 Scotoma involving central area, bilateral: Secondary | ICD-10-CM

## 2017-06-21 DIAGNOSIS — Z3493 Encounter for supervision of normal pregnancy, unspecified, third trimester: Secondary | ICD-10-CM

## 2017-06-21 DIAGNOSIS — O365930 Maternal care for other known or suspected poor fetal growth, third trimester, not applicable or unspecified: Secondary | ICD-10-CM | POA: Insufficient documentation

## 2017-06-21 DIAGNOSIS — Z3490 Encounter for supervision of normal pregnancy, unspecified, unspecified trimester: Secondary | ICD-10-CM

## 2017-06-21 DIAGNOSIS — O2613 Low weight gain in pregnancy, third trimester: Secondary | ICD-10-CM

## 2017-06-21 NOTE — Patient Instructions (Signed)
Braxton Hicks Contractions °Contractions of the uterus can occur throughout pregnancy, but they are not always a sign that you are in labor. You may have practice contractions called Braxton Hicks contractions. These false labor contractions are sometimes confused with true labor. °What are Braxton Hicks contractions? °Braxton Hicks contractions are tightening movements that occur in the muscles of the uterus before labor. Unlike true labor contractions, these contractions do not result in opening (dilation) and thinning of the cervix. Toward the end of pregnancy (32-34 weeks), Braxton Hicks contractions can happen more often and may become stronger. These contractions are sometimes difficult to tell apart from true labor because they can be very uncomfortable. You should not feel embarrassed if you go to the hospital with false labor. °Sometimes, the only way to tell if you are in true labor is for your health care provider to look for changes in the cervix. The health care provider will do a physical exam and may monitor your contractions. If you are not in true labor, the exam should show that your cervix is not dilating and your water has not broken. °If there are other health problems associated with your pregnancy, it is completely safe for you to be sent home with false labor. You may continue to have Braxton Hicks contractions until you go into true labor. °How to tell the difference between true labor and false labor °True labor °· Contractions last 30-70 seconds. °· Contractions become very regular. °· Discomfort is usually felt in the top of the uterus, and it spreads to the lower abdomen and low back. °· Contractions do not go away with walking. °· Contractions usually become more intense and increase in frequency. °· The cervix dilates and gets thinner. °False labor °· Contractions are usually shorter and not as strong as true labor contractions. °· Contractions are usually irregular. °· Contractions  are often felt in the front of the lower abdomen and in the groin. °· Contractions may go away when you walk around or change positions while lying down. °· Contractions get weaker and are shorter-lasting as time goes on. °· The cervix usually does not dilate or become thin. °Follow these instructions at home: °· Take over-the-counter and prescription medicines only as told by your health care provider. °· Keep up with your usual exercises and follow other instructions from your health care provider. °· Eat and drink lightly if you think you are going into labor. °· If Braxton Hicks contractions are making you uncomfortable: °? Change your position from lying down or resting to walking, or change from walking to resting. °? Sit and rest in a tub of warm water. °? Drink enough fluid to keep your urine pale yellow. Dehydration may cause these contractions. °? Do slow and deep breathing several times an hour. °· Keep all follow-up prenatal visits as told by your health care provider. This is important. °Contact a health care provider if: °· You have a fever. °· You have continuous pain in your abdomen. °Get help right away if: °· Your contractions become stronger, more regular, and closer together. °· You have fluid leaking or gushing from your vagina. °· You pass blood-tinged mucus (bloody show). °· You have bleeding from your vagina. °· You have low back pain that you never had before. °· You feel your baby’s head pushing down and causing pelvic pressure. °· Your baby is not moving inside you as much as it used to. °Summary °· Contractions that occur before labor are called Braxton   Hicks contractions, false labor, or practice contractions. °· Braxton Hicks contractions are usually shorter, weaker, farther apart, and less regular than true labor contractions. True labor contractions usually become progressively stronger and regular and they become more frequent. °· Manage discomfort from Braxton Hicks contractions by  changing position, resting in a warm bath, drinking plenty of water, or practicing deep breathing. °This information is not intended to replace advice given to you by your health care provider. Make sure you discuss any questions you have with your health care provider. °Document Released: 09/27/2016 Document Revised: 09/27/2016 Document Reviewed: 09/27/2016 °Elsevier Interactive Patient Education © 2018 Elsevier Inc. ° °

## 2017-06-21 NOTE — Progress Notes (Signed)
   PRENATAL VISIT NOTE  Subjective:  Shari Skinner is a 22 y.o. G2P0010 at 47w4dbeing seen today for ongoing prenatal care.  She is currently monitored for the following issues for this high-risk pregnancy and has Encounter for supervision of low-risk pregnancy, antepartum; Nausea and vomiting during pregnancy prior to [redacted] weeks gestation; Anxiety and depression; Abnormal chromosomal and genetic finding on antenatal screening mother; Poor weight gain of pregnancy; Abnormal ultrasonic finding on antenatal screening of mother, antepartum; MDD (major depressive disorder); Rh negative, antepartum; and Nausea and vomiting during pregnancy on their problem list.  Patient reports occasional contractions and seeing floating spots. Denies HA or epigastric pain.  Contractions: Not present. Vag. Bleeding: None.  Movement: Present. Denies leaking of fluid.   The following portions of the patient's history were reviewed and updated as appropriate: allergies, current medications, past family history, past medical history, past social history, past surgical history and problem list. Problem list updated.  Objective:   Vitals:   06/21/17 1049  BP: 119/74  Pulse: 86  Weight: 179 lb (81.2 kg)    Fetal Status: Fetal Heart Rate (bpm): 142 Fundal Height: 34 cm Movement: Present  Presentation: Vertex  General:  Alert, oriented and cooperative. Patient is in no acute distress.  Skin: Skin is warm and dry. No rash noted.   Cardiovascular: Normal heart rate noted  Respiratory: Normal respiratory effort, no problems with respiration noted  Abdomen: Soft, gravid, appropriate for gestational age.  Pain/Pressure: Present     Pelvic: Cervical exam deferred        Extremities: Normal range of motion.  Edema: Trace  Mental Status:  Normal mood and affect. Normal behavior. Normal judgment and thought content.   Assessment and Plan:  Pregnancy: G2P0010 at 316w4d1. Visual field scotoma of both eyes. Nml BP. 1+  Protein in urine  - CBC - Comp Met (CMET) - Protein / creatinine ratio, urine - Pre-E precautions  2. Abnormal ultrasonic finding on antenatal screening of mother, antepartum - Growth [place]  3. Low weight gain during pregnancy in third trimester - Good appetite. Gained three Lb since past visit - Growth [place]  4. Encounter for supervision of low-risk pregnancy, antepartum    Preterm labor symptoms and general obstetric precautions including but not limited to vaginal bleeding, contractions, leaking of fluid and fetal movement were reviewed in detail with the patient. Please refer to After Visit Summary for other counseling recommendations.  F/U 1 week ROB/GBS/Pap   ViManya SilvasCNM

## 2017-06-21 NOTE — Progress Notes (Signed)
Pt states that she sometimes sees "Sparklers"  She also c/o's just not feeling right but can't ellaborate

## 2017-06-22 LAB — COMPREHENSIVE METABOLIC PANEL
AG RATIO: 1.2 (calc) (ref 1.0–2.5)
ALKALINE PHOSPHATASE (APISO): 191 U/L — AB (ref 33–115)
ALT: 10 U/L (ref 6–29)
AST: 17 U/L (ref 10–30)
Albumin: 3 g/dL — ABNORMAL LOW (ref 3.6–5.1)
BUN: 10 mg/dL (ref 7–25)
CHLORIDE: 106 mmol/L (ref 98–110)
CO2: 26 mmol/L (ref 20–32)
Calcium: 8.6 mg/dL (ref 8.6–10.2)
Creat: 0.63 mg/dL (ref 0.50–1.10)
GLOBULIN: 2.5 g/dL (ref 1.9–3.7)
Glucose, Bld: 81 mg/dL (ref 65–99)
Potassium: 4.6 mmol/L (ref 3.5–5.3)
Sodium: 138 mmol/L (ref 135–146)
Total Bilirubin: 0.3 mg/dL (ref 0.2–1.2)
Total Protein: 5.5 g/dL — ABNORMAL LOW (ref 6.1–8.1)

## 2017-06-22 LAB — PROTEIN / CREATININE RATIO, URINE
CREATININE, URINE: 225 mg/dL (ref 20–275)
PROTEIN/CREAT RATIO: 191 mg/g{creat} — AB (ref 21–161)
TOTAL PROTEIN, URINE: 43 mg/dL — AB (ref 5–24)

## 2017-06-22 LAB — CBC
HCT: 34.6 % — ABNORMAL LOW (ref 35.0–45.0)
HEMOGLOBIN: 11.9 g/dL (ref 11.7–15.5)
MCH: 31.2 pg (ref 27.0–33.0)
MCHC: 34.4 g/dL (ref 32.0–36.0)
MCV: 90.6 fL (ref 80.0–100.0)
MPV: 12.5 fL (ref 7.5–12.5)
Platelets: 151 10*3/uL (ref 140–400)
RBC: 3.82 10*6/uL (ref 3.80–5.10)
RDW: 11.9 % (ref 11.0–15.0)
WBC: 7.7 10*3/uL (ref 3.8–10.8)

## 2017-06-23 DIAGNOSIS — O365990 Maternal care for other known or suspected poor fetal growth, unspecified trimester, not applicable or unspecified: Secondary | ICD-10-CM | POA: Insufficient documentation

## 2017-06-24 ENCOUNTER — Inpatient Hospital Stay (EMERGENCY_DEPARTMENT_HOSPITAL)
Admission: AD | Admit: 2017-06-24 | Discharge: 2017-06-24 | Disposition: A | Discharge status: Home / Self Care | Payer: Medicaid Other | Source: Ambulatory Visit | Attending: Obstetrics and Gynecology | Admitting: Obstetrics and Gynecology

## 2017-06-24 ENCOUNTER — Encounter (HOSPITAL_COMMUNITY): Payer: Self-pay | Admitting: *Deleted

## 2017-06-24 ENCOUNTER — Inpatient Hospital Stay (HOSPITAL_COMMUNITY)
Admission: AD | Admit: 2017-06-24 | Discharge: 2017-06-24 | Disposition: A | Discharge status: Home / Self Care | Payer: Medicaid Other | Source: Ambulatory Visit | Attending: Obstetrics and Gynecology | Admitting: Obstetrics and Gynecology

## 2017-06-24 ENCOUNTER — Other Ambulatory Visit (HOSPITAL_COMMUNITY): Payer: Self-pay | Admitting: *Deleted

## 2017-06-24 DIAGNOSIS — Z3A36 36 weeks gestation of pregnancy: Secondary | ICD-10-CM | POA: Insufficient documentation

## 2017-06-24 DIAGNOSIS — Z3689 Encounter for other specified antenatal screening: Secondary | ICD-10-CM

## 2017-06-24 DIAGNOSIS — Z880 Allergy status to penicillin: Secondary | ICD-10-CM | POA: Diagnosis not present

## 2017-06-24 DIAGNOSIS — Z885 Allergy status to narcotic agent status: Secondary | ICD-10-CM | POA: Diagnosis not present

## 2017-06-24 DIAGNOSIS — O368130 Decreased fetal movements, third trimester, not applicable or unspecified: Secondary | ICD-10-CM

## 2017-06-24 DIAGNOSIS — Z3493 Encounter for supervision of normal pregnancy, unspecified, third trimester: Secondary | ICD-10-CM

## 2017-06-24 DIAGNOSIS — Z87891 Personal history of nicotine dependence: Secondary | ICD-10-CM | POA: Insufficient documentation

## 2017-06-24 NOTE — Discharge Instructions (Signed)
Fetal Movement Counts Patient Name: ________________________________________________ Patient Due Date: ____________________ What is a fetal movement count? A fetal movement count is the number of times that you feel your baby move during a certain amount of time. This may also be called a fetal kick count. A fetal movement count is recommended for every pregnant woman. You may be asked to start counting fetal movements as early as week 28 of your pregnancy. Pay attention to when your baby is most active. You may notice your baby's sleep and wake cycles. You may also notice things that make your baby move more. You should do a fetal movement count:  When your baby is normally most active.  At the same time each day.  A good time to count movements is while you are resting, after having something to eat and drink. How do I count fetal movements? 1. Find a quiet, comfortable area. Sit, or lie down on your side. 2. Write down the date, the start time and stop time, and the number of movements that you felt between those two times. Take this information with you to your health care visits. 3. For 2 hours, count kicks, flutters, swishes, rolls, and jabs. You should feel at least 10 movements during 2 hours. 4. You may stop counting after you have felt 10 movements. 5. If you do not feel 10 movements in 2 hours, have something to eat and drink, return to MAU Contact a health care provider if:  You feel fewer than 4 movements in 2 hours.  Your baby is not moving like he or she usually does. Date: ____________ Start time: ____________ Stop time: ____________ Movements: ____________ Date: ____________ Start time: ____________ Stop time: ____________ Movements: ____________ Date: ____________ Start time: ____________ Stop time: ____________ Movements: ____________ Date: ____________ Start time: ____________ Stop time: ____________ Movements: ____________ Date: ____________ Start time: ____________  Stop time: ____________ Movements: ____________ Date: ____________ Start time: ____________ Stop time: ____________ Movements: ____________ Date: ____________ Start time: ____________ Stop time: ____________ Movements: ____________ Date: ____________ Start time: ____________ Stop time: ____________ Movements: ____________ Date: ____________ Start time: ____________ Stop time: ____________ Movements: ____________ This information is not intended to replace advice given to you by your health care provider. Make sure you discuss any questions you have with your health care provider. Document Released: 06/13/2006 Document Revised: 01/11/2016 Document Reviewed: 06/23/2015 Elsevier Interactive Patient Education  [employer]2018 Elsevier Inc.

## 2017-06-24 NOTE — MAU Provider Note (Signed)
History     CSN: [id number]  Arrival date and time: 06/24/17 2118   None     Chief Complaint  Patient presents with  . Decreased Fetal Movement   Presents with decreased fetal movement for a few days. She reports that she went home after being dc'd from the MAU with instructions for kick counts, and still had not felt fetal movement after several hours so she came back in. Told to feel 10 movements / 2hrs. Has been on multiple sides, tried fluids, chocolate, caffeine, quiet room without interaction without results. She does report feeling baby move now that she is on the monitor. Denies any other concerns, including contractions, leakage of fluid or vaginal bleeding. Also denies any abnormal vaginal discharge, vaginal bleeding, fevers, chills, malaise, dysuria, hematuria, urinary frequency, nausea, vomiting, diarrhea, RUQ/epigastric pain, dizziness/lighreadhess, or headache. H/o SGA throughout this pregnancy. BPP on 1/25 8/8, EFW < 10%, UA dopplers elevated. Has a Rx zoloft but hasn't been taking because she forgets. She is frustrated that she had to return twice in the same day for the same problem.   OB History    Gravida Para Term Preterm AB Living   2       1     SAB TAB Ectopic Multiple Live Births   1              Past Medical History:  Diagnosis Date  . Anxiety   . Depression     Past Surgical History:  Procedure Laterality Date  . APPENDECTOMY  2013  . WISDOM TOOTH EXTRACTION      Family History  Problem Relation Age of Onset  . Diabetes Mother   . Diabetes Father     Social History   Tobacco Use  . Smoking status: Former Smoker    Packs/day: 0.50    Years: 3.00    Pack years: 1.50    Types: Cigarettes    Last attempt to quit: 09/25/2016    Years since quitting: 0.7  . Smokeless tobacco: Never Used  Substance Use Topics  . Alcohol use: No  . Drug use: No    Allergies:  Allergies  Allergen Reactions  . Amoxicillin Other (See Comments)    Not sure of  reaction- maybe a rash- childhood allergy Has patient had a PCN reaction causing immediate rash, facial/tongue/throat swelling, SOB or lightheadedness with hypotension: Unknown Has patient had a PCN reaction causing severe rash involving mucus membranes or skin necrosis: Unknown Has patient had a PCN reaction that required hospitalization: Unknown Has patient had a PCN reaction occurring within the last 10 years: No If all of the above answers are "NO", then may proceed with Cephalospor  . Clindamycin/Lincomycin Other (See Comments)    Not sure of reaction- maybe rash  . Hydrocodone-Acetaminophen Rash  . Strawberry Leaves Extract Rash    Fruit (strawberry)    Medications Prior to Admission  Medication Sig Dispense Refill Last Dose  . calcium carbonate (TUMS - DOSED IN MG ELEMENTAL CALCIUM) 500 MG chewable tablet Chew 1 tablet by mouth 2 (two) times daily as needed for indigestion or heartburn.   Past Week at Unknown time  . Doxylamine-Pyridoxine (DICLEGIS) 10-10 MG TBEC Take 2 tabs at bedtime. If needed, add another tab in the morning. If needed, add another tab in the afternoon, up to 4 tabs/day. (Patient not taking: Reported on 06/07/2017) 100 tablet 5 Not Taking  . metoCLOPramide (REGLAN) 10 MG tablet Take 1 tablet (10 mg total) by  mouth every 6 (six) hours as needed for nausea. (Patient not taking: Reported on 06/07/2017) 30 tablet 1 Not Taking at Unknown time  . prenatal vitamin w/FE, FA (PRENATAL 1 + 1) 27-1 MG TABS tablet Take 1 tablet by mouth daily at 12 noon.   06/23/2017 at Unknown time  . promethazine (PHENERGAN) 25 MG suppository Place 1 suppository (25 mg total) rectally every 6 (six) hours as needed for nausea or vomiting. (Patient not taking: Reported on 06/07/2017) 12 each 0 Not Taking at Unknown time  . sertraline (ZOLOFT) 25 MG tablet Take 1 tablet (25 mg total) by mouth daily. (Patient not taking: Reported on 06/07/2017) 30 tablet 1 Not Taking at Unknown time    Review of  Systems  Constitutional: Negative for activity change, chills, fatigue and fever.  HENT: Negative for rhinorrhea and sinus pain.   Eyes: Negative.   Respiratory: Negative for cough and shortness of breath.   Cardiovascular: Negative for chest pain, palpitations and leg swelling.  Gastrointestinal: Positive for nausea. Negative for abdominal pain, diarrhea and vomiting.  Endocrine: Negative.   Genitourinary: Negative.   Musculoskeletal: Positive for back pain. Negative for gait problem.  Skin: Negative for rash.  Neurological: Negative for dizziness, light-headedness and headaches.  Psychiatric/Behavioral: Negative.    Physical Exam   Blood pressure 108/77, pulse 81, temperature 98 F (36.7 C), temperature source Oral, resp. rate 18, last menstrual period 10/06/2016, SpO2 100 %, unknown if currently breastfeeding.  Physical Exam  Constitutional: She is oriented to person, place, and time. She appears well-developed and well-nourished. No distress.  HENT:  Head: Normocephalic and atraumatic.  Eyes: Conjunctivae are normal. Pupils are equal, round, and reactive to light.  Neck: Normal range of motion.  Cardiovascular: Normal rate, regular rhythm and normal heart sounds.  No murmur heard. Respiratory: Effort normal and breath sounds normal. No respiratory distress.  GI: Soft. Bowel sounds are normal. She exhibits no distension.  Musculoskeletal: She exhibits no edema or deformity.  Neurological: She is alert and oriented to person, place, and time.  Skin: Skin is warm and dry.  Psychiatric: She has a normal mood and affect. Her behavior is normal. Judgment and thought content normal.   Fetal Monitoring: FHT: 131 bpm Moderate variability +accel -decel  MAU Course  Procedures  MDM Nursing notes and VS reviewed. Patient seen and examined, as noted above.  Reactive NST. Had one variable deceleration while here, with quick recovery, and strip remained reactive after that.    BPP from 3 days ago 8/8, UA doppler from 3 days ago with S/D 5.33, >97%ile,  RI 0.81. Plan to repeat U/S in 1 week, and induce at 37 weeks if dopplers still elevated  No further testing needed at this time. Has OB follow-up soon.  Discussed findings with Dr. Vergie Living. Okay to d/c with strict fetal kick count precautions, repeat UA dopplers and BPP Friday (1 week from last).   Assessment and Plan  1. Decreased fetal movements in the third trimester 2. NST (non-stress test) reactve  - Discharge home in stable condition - Reassured about fetal status. Strict fetal kick counts, counseled on quality of silent setting during fetal counts, info provided, u/s in 4 days as scheduled - keep prenatal appointment in 4 days -Return precautions given  Alroy Bailiff 06/24/2017, 10:24 PM    OB FELLOW MAU DISCHARGE ATTESTATION  I have seen and examined this patient. I agree with above documentation in resident's note and have made edits as needed.  Caryl Ada, DO

## 2017-06-24 NOTE — Progress Notes (Addendum)
Dr. Nira Retortegele informed of ? Variable decel @ 1155,  minimal variability since then.

## 2017-06-24 NOTE — MAU Note (Signed)
Pt has only felt baby move 3 times since she woke up this morning.  States she is being followed @ [facility] for ? Cord blood flow.  Denies contractions, bleeding or LOF.

## 2017-06-24 NOTE — MAU Provider Note (Signed)
History  CSN: [id number] Arrival date and time: 06/24/17 1112  First Provider Initiated Contact with Patient 06/24/17 1205      Chief Complaint  Patient presents with  . Decreased Fetal Movement    HPI: Shari Skinner is a 22 y.o. G2P0010 with IUP at 1749w0d who presents to maternity admissions reporting decreased fetal movement. Reports she has felt baby move somewhat less in the last couple of day, than today she only felt baby move 4 times this morning. Denies any other concerns, including contractions, leakage of fluid or vaginal bleeding. Also denies any abnormal vaginal discharge, vaginal bleeding, fevers, chills, malaise, dysuria, hematuria, urinary frequency, nausea, vomiting, diarrhea, RUQ/epigastric pain, dizziness/lighreadhess, or headache.   [facility] Pregnancy complicated by SGA, for which she is being monitored, and had BPP and U/S with dopplers done 3 days ago.    OB History  Gravida Para Term Preterm AB Living  2       1    SAB TAB Ectopic Multiple Live Births  1            # Outcome Date GA Lbr Len/2nd Weight Sex Delivery Anes PTL Lv  2 Current           1 SAB 2014             Past Medical History:  Diagnosis Date  . Anxiety   . Depression    Past Surgical History:  Procedure Laterality Date  . APPENDECTOMY  2013  . WISDOM TOOTH EXTRACTION     Family History  Problem Relation Age of Onset  . Diabetes Mother   . Diabetes Father    Social History   Socioeconomic History  . Marital status: Single    Spouse name: Not on file  . Number of children: Not on file  . Years of education: Not on file  . Highest education level: Not on file  Social Needs  . Financial resource strain: Not on file  . Food insecurity - worry: Not on file  . Food insecurity - inability: Not on file  . Transportation needs - medical: Not on file  . Transportation needs - non-medical: Not on file  Occupational History  . Not on file  Tobacco Use  .  Smoking status: Former Smoker    Packs/day: 0.50    Years: 3.00    Pack years: 1.50    Types: Cigarettes    Last attempt to quit: 09/25/2016    Years since quitting: 0.7  . Smokeless tobacco: Never Used  Substance and Sexual Activity  . Alcohol use: No  . Drug use: No  . Sexual activity: Not Currently    Birth control/protection: Condom  Other Topics Concern  . Not on file  Social History Narrative  . Not on file   Allergies  Allergen Reactions  . Amoxicillin Other (See Comments)    Not sure of reaction- maybe a rash- childhood allergy Has patient had a PCN reaction causing immediate rash, facial/tongue/throat swelling, SOB or lightheadedness with hypotension: Unknown Has patient had a PCN reaction causing severe rash involving mucus membranes or skin necrosis: Unknown Has patient had a PCN reaction that required hospitalization: Unknown Has patient had a PCN reaction occurring within the last 10 years: No If all of the above answers are "NO", then may proceed with Cephalospor  . Clindamycin/Lincomycin Other (See Comments)    Not sure of reaction- maybe rash  . Hydrocodone-Acetaminophen Rash  . Strawberry Leaves  Extract Rash    Fruit (strawberry)    Medications Prior to Admission  Medication Sig Dispense Refill Last Dose  . calcium carbonate (TUMS - DOSED IN MG ELEMENTAL CALCIUM) 500 MG chewable tablet Chew 1 tablet by mouth 2 (two) times daily as needed for indigestion or heartburn.   Past Week at Unknown time  . prenatal vitamin w/FE, FA (PRENATAL 1 + 1) 27-1 MG TABS tablet Take 1 tablet by mouth daily at 12 noon.   06/23/2017 at Unknown time  . Doxylamine-Pyridoxine (DICLEGIS) 10-10 MG TBEC Take 2 tabs at bedtime. If needed, add another tab in the morning. If needed, add another tab in the afternoon, up to 4 tabs/day. (Patient not taking: Reported on 06/07/2017) 100 tablet 5 Not Taking  . metoCLOPramide (REGLAN) 10 MG tablet Take 1 tablet (10 mg total) by mouth every 6 (six)  hours as needed for nausea. (Patient not taking: Reported on 06/07/2017) 30 tablet 1 Not Taking at Unknown time  . promethazine (PHENERGAN) 25 MG suppository Place 1 suppository (25 mg total) rectally every 6 (six) hours as needed for nausea or vomiting. (Patient not taking: Reported on 06/07/2017) 12 each 0 Not Taking at Unknown time  . sertraline (ZOLOFT) 25 MG tablet Take 1 tablet (25 mg total) by mouth daily. (Patient not taking: Reported on 06/07/2017) 30 tablet 1 Not Taking at Unknown time    I have reviewed patient's Past Medical Hx, Surgical Hx, Family Hx, Social Hx, medications and allergies.   Review of Systems: Negative except for what is mentioned in HPI.  Physical Exam   Blood pressure 123/77, pulse 91, temperature 98.6 F (37 C), temperature source Oral, resp. rate 18, last menstrual period 10/06/2016, unknown if currently breastfeeding.  Constitutional: Well-developed, well-nourished female in no acute distress.  HENT: [place]/AT, normal oropharynx mucosa. MMM Eyes: normal conjunctivae, no scleral icterus Cardiovascular: normal rate Respiratory: normal effor GI: Abd soft, non-tender, gravid appropriate for gestational age.   Pelvic: deferred MSK: Extremities nontender, no edema Neurologic: Alert and oriented x 4. Psych: Normal mood and affect Skin: warm and dry   FHT:  Baseline 150 , moderate variability, 15 x15 accelerations present, no decelerations Toco: none  MAU Course/MDM:   Nursing notes and VS reviewed. Patient seen and examined, as noted above.  Reactive NST. Had one variable deceleration while here, with quick recovery, and and strip remained reactive after that.   BPP from 3 days ago 8/8, UA doppler from 3 days ago with S/D 5.33, >97%ile,  RI 0.81. Pan to repeat U/S in 1 week, and induce at 37 weeks if dopplers still elevated  Discussed findings with Dr. Jolayne Panther. Okay to d/c with strict fetal kick count precautions, repeat UA dopplers and BPP Friday (1 week  from last).   Assessment and Plan  Assessment: 1. Decreased fetal movements in third trimester, single or unspecified fetus   2. NST (non-stress test) reactive     Plan: --Discharge home in stable condition.  --Strict fetal kick counts, sheet given, and U/S in 4 days as scheduled --Keep prenatal appt in 2 days.    Degele, Kandra Nicolas, MD 06/24/2017 2:15 PM  Future Appointments  Date Time Provider Department Center  06/26/2017 11:15 AM Lesly Dukes, MD [facility]  06/28/2017  2:15 PM [facility] [place] [facility]  07/05/2017 10:45 AM [name], CNM [facility]  07/05/2017  2:15 PM [facility] [place] [facility]  07/12/2017  1:30 PM [facility] [place] [facility]

## 2017-06-24 NOTE — MAU Note (Signed)
Was here earlier today for decreased fetal movement. Had reactive NST and was sent home w/instructions to do kick counts. Has not felt baby move since going home early afternoon. Denies LOF or bleeding; no cntrx. Had BPP 8/8 on Friday.

## 2017-06-26 ENCOUNTER — Other Ambulatory Visit (HOSPITAL_COMMUNITY)
Admission: RE | Admit: 2017-06-26 | Discharge: 2017-06-26 | Disposition: A | Discharge status: Home / Self Care | Payer: Medicaid Other | Source: Ambulatory Visit | Attending: Obstetrics & Gynecology | Admitting: Obstetrics & Gynecology

## 2017-06-26 ENCOUNTER — Ambulatory Visit (INDEPENDENT_AMBULATORY_CARE_PROVIDER_SITE_OTHER): Payer: Medicaid Other | Admitting: Obstetrics & Gynecology

## 2017-06-26 VITALS — BP 121/71 | HR 82 | Wt 179.0 lb

## 2017-06-26 DIAGNOSIS — Z3490 Encounter for supervision of normal pregnancy, unspecified, unspecified trimester: Secondary | ICD-10-CM

## 2017-06-26 DIAGNOSIS — Z3483 Encounter for supervision of other normal pregnancy, third trimester: Secondary | ICD-10-CM | POA: Insufficient documentation

## 2017-06-26 DIAGNOSIS — O365931 Maternal care for other known or suspected poor fetal growth, third trimester, fetus 1: Secondary | ICD-10-CM

## 2017-06-26 LAB — OB RESULTS CONSOLE GC/CHLAMYDIA: Gonorrhea: NEGATIVE

## 2017-06-26 LAB — OB RESULTS CONSOLE GBS: GBS: NEGATIVE

## 2017-06-26 NOTE — Progress Notes (Signed)
   PRENATAL VISIT NOTE  Subjective:  Shari Skinner is a 22 y.o. G2P0010 at 6779w2d being seen today for ongoing prenatal care.  She is currently monitored for the following issues for this high-risk pregnancy and has Encounter for supervision of low-risk pregnancy, antepartum; Nausea and vomiting during pregnancy prior to [redacted] weeks gestation; Anxiety and depression; Abnormal chromosomal and genetic finding on antenatal screening mother; Poor weight gain of pregnancy; Abnormal ultrasonic finding on antenatal screening of mother, antepartum; MDD (major depressive disorder); Rh negative, antepartum; Nausea and vomiting during pregnancy; and Small for gestational age fetus affecting management of mother on their problem list.  Patient reports no complaints.  Contractions: Not present. Vag. Bleeding: None.  Movement: Present. Denies leaking of fluid.   The following portions of the patient's history were reviewed and updated as appropriate: allergies, current medications, past family history, past medical history, past social history, past surgical history and problem list. Problem list updated.  Objective:   Vitals:   06/26/17 1126  BP: 121/71  Pulse: 82  Weight: 179 lb (81.2 kg)    Fetal Status: Fetal Heart Rate (bpm): 137 Fundal Height: 29 cm Movement: Present     General:  Alert, oriented and cooperative. Patient is in no acute distress.  Skin: Skin is warm and dry. No rash noted.   Cardiovascular: Normal heart rate noted  Respiratory: Normal respiratory effort, no problems with respiration noted  Abdomen: Soft, gravid, appropriate for gestational age.  Pain/Pressure: Present     Pelvic: Cervical exam performed      closed.  Extremities: Normal range of motion.  Edema: None  Mental Status:  Normal mood and affect. Normal behavior. Normal judgment and thought content.   Assessment and Plan:  Pregnancy: G2P0010 at 8979w2d  1. Encounter for supervision of other normal pregnancy in third  trimester - Culture, beta strep (group b only) - GC/Chlamydia probe amp ([place]  2. Poor fetal growth affecting management of mother in third trimester, fetus 1 of multiple gestation -Pt reported decreased fetal mvmt on Monday and went to MAU.  Reports good FM today.  NST today. -Dopplers on Friday and decide about delivery.  No later than 37 weeks. -Ensure shakes bid for maternal weight gain.  Term labor symptoms and general obstetric precautions including but not limited to vaginal bleeding, contractions, leaking of fluid and fetal movement were reviewed in detail with the patient. Please refer to After Visit Summary for other counseling recommendations.  Return in about 5 weeks (around 07/31/2017).   Elsie LincolnKelly Elaura Calix, MD

## 2017-06-27 LAB — GC/CHLAMYDIA PROBE AMP (~~LOC~~) NOT AT ARMC
CHLAMYDIA, DNA PROBE: NEGATIVE
NEISSERIA GONORRHEA: NEGATIVE

## 2017-06-28 ENCOUNTER — Other Ambulatory Visit: Payer: Self-pay | Admitting: Obstetrics and Gynecology

## 2017-06-28 ENCOUNTER — Encounter (HOSPITAL_COMMUNITY): Payer: Self-pay

## 2017-06-28 ENCOUNTER — Ambulatory Visit (HOSPITAL_COMMUNITY)
Admission: RE | Admit: 2017-06-28 | Discharge: 2017-06-28 | Disposition: A | Discharge status: Home / Self Care | Payer: Medicaid Other | Source: Ambulatory Visit | Attending: Certified Nurse Midwife | Admitting: Certified Nurse Midwife

## 2017-06-28 ENCOUNTER — Other Ambulatory Visit (HOSPITAL_COMMUNITY): Payer: Self-pay | Admitting: Maternal and Fetal Medicine

## 2017-06-28 DIAGNOSIS — Z3A36 36 weeks gestation of pregnancy: Secondary | ICD-10-CM

## 2017-06-28 DIAGNOSIS — O280 Abnormal hematological finding on antenatal screening of mother: Secondary | ICD-10-CM

## 2017-06-28 DIAGNOSIS — Z3A34 34 weeks gestation of pregnancy: Secondary | ICD-10-CM | POA: Diagnosis not present

## 2017-06-28 NOTE — Progress Notes (Signed)
Received call from Dr. Sherrie Georgeecker who recommends IOL for IUGR at 37 weeks. Patient has been scheduled for IOL on 07/01/2017 and was instructed to arrive by 8am. Answered questions regarding IOL. Fetal movement precautions reviewed with the patient

## 2017-06-29 LAB — CULTURE, BETA STREP (GROUP B ONLY)
MICRO NUMBER: 90127892
SPECIMEN QUALITY: ADEQUATE

## 2017-07-01 ENCOUNTER — Inpatient Hospital Stay (HOSPITAL_COMMUNITY)
Admission: RE | Admit: 2017-07-01 | Discharge: 2017-07-04 | DRG: 807 | Disposition: A | Discharge status: Home / Self Care | Payer: Medicaid Other | Source: Ambulatory Visit | Attending: Family Medicine | Admitting: Family Medicine

## 2017-07-01 ENCOUNTER — Encounter (HOSPITAL_COMMUNITY): Payer: Self-pay

## 2017-07-01 DIAGNOSIS — O219 Vomiting of pregnancy, unspecified: Secondary | ICD-10-CM | POA: Diagnosis present

## 2017-07-01 DIAGNOSIS — Z3A37 37 weeks gestation of pregnancy: Secondary | ICD-10-CM

## 2017-07-01 DIAGNOSIS — O26893 Other specified pregnancy related conditions, third trimester: Secondary | ICD-10-CM | POA: Diagnosis present

## 2017-07-01 DIAGNOSIS — O283 Abnormal ultrasonic finding on antenatal screening of mother: Secondary | ICD-10-CM | POA: Diagnosis present

## 2017-07-01 DIAGNOSIS — O285 Abnormal chromosomal and genetic finding on antenatal screening of mother: Secondary | ICD-10-CM | POA: Diagnosis present

## 2017-07-01 DIAGNOSIS — Z3490 Encounter for supervision of normal pregnancy, unspecified, unspecified trimester: Secondary | ICD-10-CM

## 2017-07-01 DIAGNOSIS — Z880 Allergy status to penicillin: Secondary | ICD-10-CM | POA: Diagnosis not present

## 2017-07-01 DIAGNOSIS — O365930 Maternal care for other known or suspected poor fetal growth, third trimester, not applicable or unspecified: Principal | ICD-10-CM | POA: Diagnosis present

## 2017-07-01 DIAGNOSIS — Z87891 Personal history of nicotine dependence: Secondary | ICD-10-CM | POA: Diagnosis not present

## 2017-07-01 DIAGNOSIS — F329 Major depressive disorder, single episode, unspecified: Secondary | ICD-10-CM | POA: Diagnosis present

## 2017-07-01 DIAGNOSIS — O99344 Other mental disorders complicating childbirth: Secondary | ICD-10-CM | POA: Diagnosis present

## 2017-07-01 DIAGNOSIS — O365990 Maternal care for other known or suspected poor fetal growth, unspecified trimester, not applicable or unspecified: Secondary | ICD-10-CM | POA: Diagnosis present

## 2017-07-01 DIAGNOSIS — O2610 Low weight gain in pregnancy, unspecified trimester: Secondary | ICD-10-CM | POA: Diagnosis present

## 2017-07-01 DIAGNOSIS — Z6791 Unspecified blood type, Rh negative: Secondary | ICD-10-CM | POA: Diagnosis not present

## 2017-07-01 DIAGNOSIS — F419 Anxiety disorder, unspecified: Secondary | ICD-10-CM | POA: Diagnosis present

## 2017-07-01 LAB — RPR: RPR Ser Ql: NONREACTIVE

## 2017-07-01 LAB — CBC
HEMATOCRIT: 36.5 % (ref 36.0–46.0)
HEMOGLOBIN: 12.6 g/dL (ref 12.0–15.0)
MCH: 32.1 pg (ref 26.0–34.0)
MCHC: 34.5 g/dL (ref 30.0–36.0)
MCV: 93.1 fL (ref 78.0–100.0)
Platelets: 153 10*3/uL (ref 150–400)
RBC: 3.92 MIL/uL (ref 3.87–5.11)
RDW: 12.5 % (ref 11.5–15.5)
WBC: 11.1 10*3/uL — ABNORMAL HIGH (ref 4.0–10.5)

## 2017-07-01 MED ORDER — LIDOCAINE HCL (PF) 1 % IJ SOLN
30.0000 mL | INTRAMUSCULAR | Status: DC | PRN
  Administered 2017-07-02: 30 mL via SUBCUTANEOUS
  Filled 2017-07-01: qty 30

## 2017-07-01 MED ORDER — OXYTOCIN 40 UNITS IN LACTATED RINGERS INFUSION - SIMPLE MED
2.5000 [IU]/h | INTRAVENOUS | Status: DC
  Administered 2017-07-02: 2.5 [IU]/h via INTRAVENOUS
  Filled 2017-07-01: qty 1000

## 2017-07-01 MED ORDER — FENTANYL CITRATE (PF) 100 MCG/2ML IJ SOLN
100.0000 ug | INTRAMUSCULAR | Status: DC | PRN
  Administered 2017-07-01 – 2017-07-02 (×5): 100 ug via INTRAVENOUS
  Filled 2017-07-01 (×4): qty 2

## 2017-07-01 MED ORDER — MISOPROSTOL 50MCG HALF TABLET
50.0000 ug | ORAL_TABLET | ORAL | Status: DC
  Administered 2017-07-01 – 2017-07-02 (×4): 50 ug via ORAL
  Filled 2017-07-01 (×9): qty 1

## 2017-07-01 MED ORDER — FENTANYL CITRATE (PF) 100 MCG/2ML IJ SOLN
INTRAMUSCULAR | Status: AC
  Filled 2017-07-01: qty 2

## 2017-07-01 MED ORDER — SOD CITRATE-CITRIC ACID 500-334 MG/5ML PO SOLN: 30.0000 mL | ORAL | Status: DC | PRN

## 2017-07-01 MED ORDER — LACTATED RINGERS IV SOLN
INTRAVENOUS | Status: DC
  Administered 2017-07-01 (×2): via INTRAVENOUS
  Administered 2017-07-01: 950 mL via INTRAVENOUS
  Administered 2017-07-02 (×3): via INTRAVENOUS

## 2017-07-01 MED ORDER — LACTATED RINGERS IV SOLN
500.0000 mL | INTRAVENOUS | Status: DC | PRN
  Administered 2017-07-02: 1000 mL via INTRAVENOUS

## 2017-07-01 MED ORDER — OXYTOCIN BOLUS FROM INFUSION
500.0000 mL | Freq: Once | INTRAVENOUS | Status: AC
  Administered 2017-07-02: 500 mL via INTRAVENOUS

## 2017-07-01 MED ORDER — ONDANSETRON HCL 4 MG/2ML IJ SOLN
4.0000 mg | Freq: Four times a day (QID) | INTRAMUSCULAR | Status: DC | PRN
  Administered 2017-07-01: 4 mg via INTRAVENOUS
  Filled 2017-07-01: qty 2

## 2017-07-01 NOTE — Progress Notes (Signed)
Dr Luberta RobertsonWinborne attempted foley

## 2017-07-01 NOTE — Anesthesia Pain Management Evaluation Note (Signed)
  CRNA Pain Management Visit Note  Patient: Shari Skinner, 22 y.o., female  "Hello I am a member of the anesthesia team at [facility]. We have an anesthesia team available at all times to provide care throughout the hospital, including epidural management and anesthesia for C-section. I don't know your plan for the delivery whether it a natural birth, water birth, IV sedation, nitrous supplementation, doula or epidural, but we want to meet your pain goals."   1.Was your pain managed to your expectations on prior hospitalizations?   No prior hospitalizations  2.What is your expectation for pain management during this hospitalization?     Epidural  3.How can we help you reach that goal? Epidural at pain goal.  Record the patient's initial score and the patient's pain goal.   Pain: 2  Pain Goal: 6 [facility] wants you to be able to say your pain was always managed very well.  Shari Skinner 07/01/2017

## 2017-07-01 NOTE — H&P (Signed)
OBSTETRIC ADMISSION HISTORY AND PHYSICAL  Shari Skinner is a 22 y.o. female G2P0010 with IUP at [redacted]w[redacted]d by 59 w [place]  presenting for IUGR . She reports +FMs, No LOF, no VB, no blurry vision, headaches or peripheral edema, and RUQ pain.  She plans on breast  feeding. She unsure what she wants for birth control. She received her prenatal care at [facility]   Dating: By 9w [place] --->  Estimated Date of Delivery: 07/22/17  Sono:  06/21/16  @33w1  d, CWD, normal anatomy, cephalic  presentation, posterior placenta , 2002g, <10%% EFW  Clinic K[facility] Prenatal Labs  Dating 9 week [place] at [facility] Blood type:   O-  Genetic Screen 1 Screen: AFP/Quad: Abn  Ref to MFM and genetics   NIPS: low risk female Antibody:NEG (08/31 1103)  Anatomic [place] Normal but MFM wants f/u in 4-6 weeks Rubella: <0.90 (08/31 1103)  GTT Early:              Third trimester: NML RPR: NON REAC (08/31 1103)   Flu vaccine  9/19 HBsAg: NON-REACTIVE (08/31 1103)   TDaP vaccine 03/30/17                           Rhogam: 04/26/17 HIV: NONREACTIVE (08/31 1103)   Baby Food Breast                                         GBS: negative  Contraception undecided Pap: n/a  Circumcision Yes   Pediatrician Dr. Starla Link Peds CF:  Support Person Pt mother SMA  Prenatal Classes Offered Hgb electrophoresis:   Prenatal History/Complications:  Past Medical History: Past Medical History:  Diagnosis Date  . Anxiety   . Depression   abnormal chromosomal and genetic findings on antenatal screening IUGR  Past Surgical History: Past Surgical History:  Procedure Laterality Date  . APPENDECTOMY  2013  . WISDOM TOOTH EXTRACTION      Obstetrical History: OB History    Gravida Para Term Preterm AB Living   2       1 0   SAB TAB Ectopic Multiple Live Births   1              Social History: Social History   Socioeconomic History  . Marital status: Single    Spouse name: None  . Number of children: None  . Years of education: None  . Highest  education level: None  Social Needs  . Financial resource strain: None  . Food insecurity - worry: None  . Food insecurity - inability: None  . Transportation needs - medical: None  . Transportation needs - non-medical: None  Occupational History  . None  Tobacco Use  . Smoking status: Former Smoker    Packs/day: 0.50    Years: 3.00    Pack years: 1.50    Types: Cigarettes    Last attempt to quit: 09/25/2016    Years since quitting: 0.7  . Smokeless tobacco: Never Used  Substance and Sexual Activity  . Alcohol use: No  . Drug use: No  . Sexual activity: Not Currently    Birth control/protection: None  Other Topics Concern  . None  Social History Narrative  . None    Family History: Family History  Problem Relation Age of Onset  . Diabetes Mother   . Diabetes Father  Allergies: Allergies  Allergen Reactions  . Amoxicillin Other (See Comments)    Not sure of reaction- maybe a rash- childhood allergy Has patient had a PCN reaction causing immediate rash, facial/tongue/throat swelling, SOB or lightheadedness with hypotension: Unknown Has patient had a PCN reaction causing severe rash involving mucus membranes or skin necrosis: Unknown Has patient had a PCN reaction that required hospitalization: Unknown Has patient had a PCN reaction occurring within the last 10 years: No If all of the above answers are "NO", then may proceed with Cephalospor  . Clindamycin/Lincomycin Other (See Comments)    Not sure of reaction- maybe rash  . Hydrocodone-Acetaminophen Rash  . Strawberry Leaves Extract Rash    Fruit (strawberry)    Medications Prior to Admission  Medication Sig Dispense Refill Last Dose  . calcium carbonate (TUMS - DOSED IN MG ELEMENTAL CALCIUM) 500 MG chewable tablet Chew 1 tablet by mouth 2 (two) times daily as needed for indigestion or heartburn.   Not Taking  . Doxylamine-Pyridoxine (DICLEGIS) 10-10 MG TBEC Take 2 tabs at bedtime. If needed, add another tab in  the morning. If needed, add another tab in the afternoon, up to 4 tabs/day. (Patient not taking: Reported on 06/07/2017) 100 tablet 5 Not Taking  . metoCLOPramide (REGLAN) 10 MG tablet Take 1 tablet (10 mg total) by mouth every 6 (six) hours as needed for nausea. (Patient not taking: Reported on 06/07/2017) 30 tablet 1 Not Taking  . prenatal vitamin w/FE, FA (PRENATAL 1 + 1) 27-1 MG TABS tablet Take 1 tablet by mouth daily at 12 noon.   Taking  . promethazine (PHENERGAN) 25 MG suppository Place 1 suppository (25 mg total) rectally every 6 (six) hours as needed for nausea or vomiting. (Patient not taking: Reported on 06/07/2017) 12 each 0 Not Taking  . sertraline (ZOLOFT) 25 MG tablet Take 1 tablet (25 mg total) by mouth daily. (Patient not taking: Reported on 06/07/2017) 30 tablet 1 Not Taking     Review of Systems   All systems reviewed and negative except as stated in HPI  Blood pressure 120/87, pulse 72, temperature 98.4 F (36.9 C), temperature source Oral, resp. rate 18, last menstrual period 10/06/2016, unknown if currently breastfeeding. General appearance: alert, cooperative and appears stated age Lungs: clear to auscultation bilaterally Heart: regular rate and rhythm Abdomen: soft, non-tender; bowel sounds normal Pelvic: 1/thick/-3 Extremities: Homans sign is negative, no sign of DVT DTR's intact  Presentation: cephalic Fetal monitoringBaseline: 140 bpm Uterine activityNone Dilation: 1 Effacement (%): Thick Station: -3 Exam by:: Milus Glazier RN   Prenatal labs: ABO, Rh: --/--/O NEG (02/04 7829) Antibody: PENDING (02/04 0737) Rubella: <0.90 (08/31 1103) RPR: NON-REACTIVE (11/30 0848)  HBsAg: NON-REACTIVE (08/31 1103)  HIV: NON-REACTIVE (11/30 0848)  GBS: Negative (01/30 0000)  1 hr Glucola normal Genetic screening  1 Screen: AFP/Quad: Abn  Ref to MFM and genetics   NIPS: low risk female Anatomy US normal  Prenatal Transfer Tool  Maternal Diabetes: No Genetic  Screening: Abnormal:  Results: Other: Maternal Ultrasounds/Referrals: Normal Fetal Ultrasounds or other Referrals:  None Maternal Substance Abuse:  No Significant Maternal Medications:  None Significant Maternal Lab Results: None  Results for orders placed or performed during the hospital encounter of 07/01/17 (from the past 24 hour(s))  CBC   Collection Time: 07/01/17  7:37 AM  Result Value Ref Range   WBC 11.1 (H) 4.0 - 10.5 K/uL   RBC 3.92 3.87 - 5.11 MIL/uL   Hemoglobin 12.6 12.0 - 15.0 g/dL  HCT 36.5 36.0 - 46.0 %   MCV 93.1 78.0 - 100.0 fL   MCH 32.1 26.0 - 34.0 pg   MCHC 34.5 30.0 - 36.0 g/dL   RDW 16.112.5 09.611.5 - 04.515.5 %   Platelets 153 150 - 400 K/uL  Type and screen   Collection Time: 07/01/17  7:37 AM  Result Value Ref Range   ABO/RH(D) O NEG    Antibody Screen PENDING    Sample Expiration      07/04/2017 Performed at [facility], [address] [place], [place] [id number]     Patient Active Problem List   Diagnosis Date Noted  . IUGR (intrauterine growth restriction) affecting care of mother 07/01/2017  . Small for gestational age fetus affecting management of mother 06/23/2017  . Nausea and vomiting during pregnancy 05/24/2017  . Abnormal ultrasonic finding on antenatal screening of mother, antepartum 04/26/2017  . Rh negative, antepartum 04/26/2017  . Anxiety and depression 02/22/2017  . Abnormal chromosomal and genetic finding on antenatal screening mother 02/22/2017  . Poor weight gain of pregnancy 02/22/2017  . Encounter for supervision of low-risk pregnancy, antepartum 01/25/2017  . Nausea and vomiting during pregnancy prior to [redacted] weeks gestation 01/25/2017  . MDD (major depressive disorder) 08/18/2012    Assessment/Plan:  Tommye Standardmanda Taitt is a 22 y.o. G2P0010 at 7587w0d here for IOL for IUGR  #Labor:US showed cephalic 1/thick/-3 will start with buccal cytotec #Pain: Per patient request #FWB: category1 #ID:  GBS neg #MOF:  breast #MOC:unsure #Circ:  yest outpatient   Nigel Bridgemanourtland Talley Casco, MD  07/01/2017, 9:09 AM

## 2017-07-01 NOTE — Progress Notes (Signed)
Tommye Standardmanda Fiorini is a 22 y.o. G2P0010 at 6129w0d by ultrasound admitted for induction of labor due to Poor fetal growth.  Subjective: Patient resting comfortably. occasional contractions   Objective: BP 137/79   Pulse 69   Temp 98.4 F (36.9 C) (Oral)   Resp 20   LMP 10/06/2016 (Exact Date)  No intake/output data recorded. No intake/output data recorded.  FHT:  FHR: 140 bpm, variability: moderate,  accelerations:  Present,  decelerations:  Absent UC:   regular, every 2-4 minutes SVE:   Dilation: Fingertip Effacement (%): Thick Station: -3 Exam by:: Dherr rn  Labs: Lab Results  Component Value Date   WBC 11.1 (H) 07/01/2017   HGB 12.6 07/01/2017   HCT 36.5 07/01/2017   MCV 93.1 07/01/2017   PLT 153 07/01/2017    Assessment / Plan: IOL for IUGR  Labor: FB placed, continue cytotec  Preeclampsia:  no signs or symptoms of toxicity Fetal Wellbeing:  Category I Pain Control:  per request I/D:  n/a Anticipated MOD:  NSVD  Teddie Mehta 07/01/2017, 6:02 PM

## 2017-07-01 NOTE — Progress Notes (Signed)
Shari Skinner is a 22 y.o. G2P0010 at 8122w0d by ultrasound admitted for induction of labor due to Poor fetal growth.  Subjective: Patient resting comfortably. She is having some back pain no contractions  Objective: BP 108/60   Pulse 66   Temp 98.4 F (36.9 C) (Oral)   Resp 18   LMP 10/06/2016 (Exact Date)  No intake/output data recorded. No intake/output data recorded.  FHT:  FHR: 130 bpm, variability: moderate,  accelerations:  Present,  decelerations:  Absent UC:   irregular, every 3-7 minutes SVE:   Dilation: Fingertip Effacement (%): Thick Station: -3 Exam by:: Dherr rn  Labs: Lab Results  Component Value Date   WBC 11.1 (H) 07/01/2017   HGB 12.6 07/01/2017   HCT 36.5 07/01/2017   MCV 93.1 07/01/2017   PLT 153 07/01/2017    Assessment / Plan: IOL for IUGR  Labor: Cytotec buccal x 2 Preeclampsia:  n/a Fetal Wellbeing:  Category I Pain Control:  per patient request  I/D:  n/a Anticipated MOD:  NSVD  Vincient Vanaman 07/01/2017, 1:52 PM

## 2017-07-01 NOTE — Progress Notes (Signed)
LABOR PROGRESS NOTE  Shari Skinner is a 22 y.o. G2P0010 at 196w0d  admitted for IOl for IUGR  Subjective: Patient doing well. Denies any concerns.  Objective: BP 137/79   Pulse 88   Temp (!) 97 F (36.1 C) (Axillary)   Resp 16   LMP 10/06/2016 (Exact Date)  or  Vitals:   07/01/17 1508 07/01/17 1712 07/01/17 1757 07/01/17 1917  BP: 137/79     Pulse: 69   88  Resp:  20  16  Temp:   98.2 F (36.8 C) (!) 97 F (36.1 C)  TempSrc:   Oral Axillary    Last SVE: Dilation: Fingertip Effacement (%): 50 Cervical Position: Posterior Station: -3 Presentation: Vertex Exam by:: Arita Missawson cnm FHT: baseline rate 130, moderate varibility, +acel, no decel Toco: UI, irregular   Assessment / Plan: 22 y.o. G2P0010 at 236w0d here for IOL for IUGR  Labor: FB in place, placed at 1800. S/p cytotec x2, continue Fetal Wellbeing:  Cat I Pain Control:  Per patient's request Anticipated MOD:  SVD  Frederik PearJulie P Degele, MD 07/01/2017, 7:33 PM

## 2017-07-02 ENCOUNTER — Inpatient Hospital Stay (HOSPITAL_COMMUNITY): Payer: Medicaid Other | Admitting: Anesthesiology

## 2017-07-02 ENCOUNTER — Encounter (HOSPITAL_COMMUNITY): Payer: Self-pay

## 2017-07-02 DIAGNOSIS — Z3A37 37 weeks gestation of pregnancy: Secondary | ICD-10-CM

## 2017-07-02 DIAGNOSIS — O365930 Maternal care for other known or suspected poor fetal growth, third trimester, not applicable or unspecified: Secondary | ICD-10-CM

## 2017-07-02 MED ORDER — PRENATAL MULTIVITAMIN CH
1.0000 | ORAL_TABLET | Freq: Every day | ORAL | Status: DC
  Administered 2017-07-03 – 2017-07-04 (×2): 1 via ORAL
  Filled 2017-07-02 (×2): qty 1

## 2017-07-02 MED ORDER — WITCH HAZEL-GLYCERIN EX PADS: 1.0000 "application " | MEDICATED_PAD | CUTANEOUS | Status: DC | PRN

## 2017-07-02 MED ORDER — PHENYLEPHRINE 40 MCG/ML (10ML) SYRINGE FOR IV PUSH (FOR BLOOD PRESSURE SUPPORT)
80.0000 ug | PREFILLED_SYRINGE | INTRAVENOUS | Status: DC | PRN
  Filled 2017-07-02: qty 5

## 2017-07-02 MED ORDER — LIDOCAINE HCL (PF) 1 % IJ SOLN
INTRAMUSCULAR | Status: DC | PRN
  Administered 2017-07-02: 6 mL via EPIDURAL

## 2017-07-02 MED ORDER — ONDANSETRON HCL 4 MG PO TABS: 4.0000 mg | ORAL_TABLET | ORAL | Status: DC | PRN

## 2017-07-02 MED ORDER — METHYLERGONOVINE MALEATE 0.2 MG/ML IJ SOLN
INTRAMUSCULAR | Status: AC
  Filled 2017-07-02: qty 3

## 2017-07-02 MED ORDER — BENZOCAINE-MENTHOL 20-0.5 % EX AERO
1.0000 "application " | INHALATION_SPRAY | CUTANEOUS | Status: DC | PRN
  Administered 2017-07-02 – 2017-07-04 (×2): 1 via TOPICAL
  Filled 2017-07-02 (×2): qty 56

## 2017-07-02 MED ORDER — OXYTOCIN 40 UNITS IN LACTATED RINGERS INFUSION - SIMPLE MED
1.0000 m[IU]/min | INTRAVENOUS | Status: DC
  Administered 2017-07-02: 2 m[IU]/min via INTRAVENOUS

## 2017-07-02 MED ORDER — EPHEDRINE 5 MG/ML INJ
10.0000 mg | INTRAVENOUS | Status: DC | PRN
  Filled 2017-07-02: qty 2

## 2017-07-02 MED ORDER — DIPHENHYDRAMINE HCL 25 MG PO CAPS: 25.0000 mg | ORAL_CAPSULE | Freq: Four times a day (QID) | ORAL | Status: DC | PRN

## 2017-07-02 MED ORDER — FENTANYL 2.5 MCG/ML BUPIVACAINE 1/10 % EPIDURAL INFUSION (WH - ANES)
14.0000 mL/h | INTRAMUSCULAR | Status: DC | PRN
  Administered 2017-07-02 (×2): 14 mL/h via EPIDURAL
  Filled 2017-07-02 (×2): qty 100

## 2017-07-02 MED ORDER — PHENYLEPHRINE 40 MCG/ML (10ML) SYRINGE FOR IV PUSH (FOR BLOOD PRESSURE SUPPORT)
80.0000 ug | PREFILLED_SYRINGE | INTRAVENOUS | Status: DC | PRN
  Filled 2017-07-02: qty 5
  Filled 2017-07-02: qty 10

## 2017-07-02 MED ORDER — SIMETHICONE 80 MG PO CHEW: 80.0000 mg | CHEWABLE_TABLET | ORAL | Status: DC | PRN

## 2017-07-02 MED ORDER — TETANUS-DIPHTH-ACELL PERTUSSIS 5-2.5-18.5 LF-MCG/0.5 IM SUSP: 0.5000 mL | Freq: Once | INTRAMUSCULAR | Status: DC

## 2017-07-02 MED ORDER — SENNOSIDES-DOCUSATE SODIUM 8.6-50 MG PO TABS
2.0000 | ORAL_TABLET | ORAL | Status: DC
  Administered 2017-07-02 – 2017-07-03 (×2): 2 via ORAL
  Filled 2017-07-02 (×2): qty 2

## 2017-07-02 MED ORDER — PHENYLEPHRINE 40 MCG/ML (10ML) SYRINGE FOR IV PUSH (FOR BLOOD PRESSURE SUPPORT)
80.0000 ug | PREFILLED_SYRINGE | INTRAVENOUS | Status: DC | PRN
  Filled 2017-07-02: qty 10
  Filled 2017-07-02: qty 5

## 2017-07-02 MED ORDER — LACTATED RINGERS IV SOLN: 500.0000 mL | Freq: Once | INTRAVENOUS | Status: DC

## 2017-07-02 MED ORDER — TERBUTALINE SULFATE 1 MG/ML IJ SOLN
0.2500 mg | Freq: Once | INTRAMUSCULAR | Status: DC | PRN
  Filled 2017-07-02: qty 1

## 2017-07-02 MED ORDER — ONDANSETRON HCL 4 MG/2ML IJ SOLN: 4.0000 mg | INTRAMUSCULAR | Status: DC | PRN

## 2017-07-02 MED ORDER — IBUPROFEN 600 MG PO TABS
600.0000 mg | ORAL_TABLET | Freq: Four times a day (QID) | ORAL | Status: DC
  Administered 2017-07-02 – 2017-07-04 (×8): 600 mg via ORAL
  Filled 2017-07-02 (×8): qty 1

## 2017-07-02 MED ORDER — DIBUCAINE 1 % RE OINT: 1.0000 "application " | TOPICAL_OINTMENT | RECTAL | Status: DC | PRN

## 2017-07-02 MED ORDER — DIPHENHYDRAMINE HCL 50 MG/ML IJ SOLN: 12.5000 mg | INTRAMUSCULAR | Status: DC | PRN

## 2017-07-02 MED ORDER — COCONUT OIL OIL
1.0000 "application " | TOPICAL_OIL | Status: DC | PRN
  Administered 2017-07-04: 1 via TOPICAL
  Filled 2017-07-02: qty 120

## 2017-07-02 NOTE — Progress Notes (Signed)
Labor Progress Note Shari Skinner is a 22 y.o. G2P0010 at 1459w1d by 9 week US presented for IOL for IUGR.    S:  Patient reports balloon has fallen out.  Contractions are increasing in frequency and duration.    O:  BP 120/88   Pulse 75   Temp 97.9 F (36.6 C) (Oral)   Resp 16   LMP 10/06/2016 (Exact Date)  Fetal monitoring: Baseline: 145 bpm, Variability: Good {> 6 bpm), Accelerations: Reactive and Decelerations: Variable: mild Uterine activity: Frequency: Every 2-3 minutes  CVE: Dilation: 4.5 Effacement (%): 60 Cervical Position: Posterior Station: Ballotable Presentation: Vertex Exam by:: Rolene Arbouresmaine Lewis, RN   A&P: 22 y.o. G2P0010 4059w1d by 9 week US presented for IOL for IUGR.   #Labor: s/p cytotec x4 and FB, starting pitocin #Pain: epidural placed #FWB: Category I #GBS negative   Ames Coupeharles A Javanna Patin, Medical Student 6:03 AM

## 2017-07-02 NOTE — Progress Notes (Signed)
Labor Progress Note Shari Skinner is a 22 y.o. G2P0010 at 1563w1d by 9 week US presented for IOL for IUGR.    S:  Patient reports overall discomfort and increased pain with contractions.  Fentanyl was provided.     O:  BP 98/67 (BP Location: Right Arm)   Pulse 88   Temp (!) 97 F (36.1 C) (Axillary)   Resp 16   LMP 10/06/2016 (Exact Date)  Fetal monitoring: Baseline: 130 bpm, Variability: Good {> 6 bpm), Accelerations: Reactive and Decelerations: Variable: mild and infrequent  Uterine activity: Frequency: Every 8-10 minutes  CVE: Dilation: Fingertip Effacement (%): 50 Cervical Position: Posterior Station: -3 Presentation: Vertex Exam by:: Arita Missawson cnm   A&P: 22 y.o. G2P0010 3663w1d by 9 week US presented for IOL for IUGR.   #Labor: FB remains in place, cytotec x4, continue  #Pain: moderate but controlled, will use epidural during delivery  #FWB: Category I #GBS negative   Ames Coupeharles A Temica Righetti, Medical Student 2:08 AM

## 2017-07-02 NOTE — Anesthesia Procedure Notes (Signed)
Epidural Patient location during procedure: OB Start time: 07/02/2017 5:04 AM End time: 07/02/2017 5:16 AM  Staffing Anesthesiologist: Trevor IhaHouser, Esperanza Madrazo A, MD Performed: anesthesiologist   Preanesthetic Checklist Completed: patient identified, site marked, surgical consent, pre-op evaluation, timeout performed, IV checked, risks and benefits discussed and monitors and equipment checked  Epidural Patient position: sitting Prep: site prepped and draped and DuraPrep Patient monitoring: continuous pulse ox and blood pressure Approach: midline Location: L2-L3 Injection technique: LOR air  Needle:  Needle type: Tuohy  Needle gauge: 17 G Needle length: 9 cm and 9 Needle insertion depth: 5 cm cm Catheter type: closed end flexible Catheter size: 19 Gauge Catheter at skin depth: 11 cm Test dose: negative  Assessment Events: blood not aspirated, injection not painful, no injection resistance, negative IV test and no paresthesia

## 2017-07-02 NOTE — Plan of Care (Signed)
Progressing appropriately. Assisted to bathroom. Encouraged to call for assistance with breastfeeding as needed, and for [facility].

## 2017-07-02 NOTE — Progress Notes (Addendum)
Shari Skinner is a 22 y.o. G2P0010 at 6653w1d by ultrasound admitted for induction of labor due to Poor fetal growth.  Subjective: Patient doing well this morning. Epidural inplace. Pitocin at 10.  Objective: BP 106/62   Pulse (!) 59   Temp (!) 97.5 F (36.4 C) (Oral)   Resp 18   LMP 10/06/2016 (Exact Date)  No intake/output data recorded. No intake/output data recorded.  FHT:  FHR: 150 bpm, variability: moderate,  accelerations:  Present,  decelerations:  Present one variable mother recently changed position UC:   regular, every 3-4 minutes SVE:   Dilation: 7 Effacement (%): 70 Station: -3 Exam by:: Marius Ditchaniell Wade, Rn   Labs: Lab Results  Component Value Date   WBC 11.1 (H) 07/01/2017   HGB 12.6 07/01/2017   HCT 36.5 07/01/2017   MCV 93.1 07/01/2017   PLT 153 07/01/2017    Assessment / Plan: Induction of labor due to IUGR,  progressing well on pitocin  Labor: Progressing on Pitocin, will continue to increase then AROM, AROM @ 10:10 clear  Preeclampsia:  no signs or symptoms of toxicity Fetal Wellbeing:  Category I Pain Control:  Epidural I/D:  n/a Anticipated MOD:  NSVD  Marlene Beidler 07/02/2017, 9:48 AM

## 2017-07-02 NOTE — Progress Notes (Signed)
Shari Skinner is a 22 y.o. G2P0010 at 1515w1d by ultrasound admitted for induction of labor due to Poor fetal growth.  Subjective: Patient with some pain with contractions. Epidural in place. SROM  Objective: BP 115/64   Pulse 66   Temp 98.1 F (36.7 C) (Oral)   Resp 18   LMP 10/06/2016 (Exact Date)  No intake/output data recorded. Total I/O In: -  Out: 900 [Urine:900]  FHT:  FHR: 150 bpm, variability: moderate,  accelerations:  Present,  decelerations:  Absent UC:   regular, every 1-3 minutes SVE:   Dilation: 7.5 Effacement (%): 70 Station: -2, -3 Exam by:: Shari SlipperJane Bailey, RN  Labs: Lab Results  Component Value Date   WBC 11.1 (H) 07/01/2017   HGB 12.6 07/01/2017   HCT 36.5 07/01/2017   MCV 93.1 07/01/2017   PLT 153 07/01/2017    Assessment / Plan: Induction of labor due to IUGR,  progressing well on pitocin  Labor: Progressing normally  With pitocin, IUPC placed Preeclampsia:  n/a Fetal Wellbeing:  Category I Pain Control:  Epidural I/D:  n/a Anticipated MOD:  NSVD  Shari Skinner 07/02/2017, 2:08 PM

## 2017-07-02 NOTE — Anesthesia Preprocedure Evaluation (Signed)
Anesthesia Evaluation  Patient identified by MRN, date of birth, ID band Patient awake    Airway Mallampati: II  TM Distance: >3 FB Neck ROM: Full    Dental no notable dental hx. (+) Teeth Intact   Pulmonary neg pulmonary ROS, former smoker,    Pulmonary exam normal        Cardiovascular negative cardio ROS Normal cardiovascular exam     Neuro/Psych    GI/Hepatic negative GI ROS,   Endo/Other    Renal/GU      Musculoskeletal   Abdominal   Peds  Hematology   Anesthesia Other Findings   Reproductive/Obstetrics (+) Pregnancy                             Anesthesia Physical Anesthesia Plan  ASA: II  Anesthesia Plan: Epidural   Post-op Pain Management:    Induction:   PONV Risk Score and Plan:   Airway Management Planned:   Additional Equipment:   Intra-op Plan:   Post-operative Plan:   Informed Consent: I have reviewed the patients History and Physical, chart, labs and discussed the procedure including the risks, benefits and alternatives for the proposed anesthesia with the patient or authorized representative who has indicated his/her understanding and acceptance.     Plan Discussed with:   Anesthesia Plan Comments:         Anesthesia Quick Evaluation

## 2017-07-03 MED ORDER — OXYCODONE-ACETAMINOPHEN 5-325 MG PO TABS
2.0000 | ORAL_TABLET | ORAL | Status: DC | PRN
  Administered 2017-07-03 – 2017-07-04 (×3): 2 via ORAL
  Filled 2017-07-03 (×3): qty 2

## 2017-07-03 NOTE — Lactation Note (Signed)
This note was copied from a baby's chart. Lactation Consultation Note  Patient Name: Boy Tommye Standardmanda Vizzini [id number]'s Date: 07/03/2017 Reason for consult: Initial assessment;NICU baby;Early term 37-38.6wks;Infant < 6lbs   Baby 28 hours old in NICU.  2871w1d. Mother pumping with DEBP upon entering the room. Reviewed hands on pumping and referred her to video. Mother has also been hand expressing and bringing colostrum to the NICU. Recommend mother post pump 8 times per day for 10-20 min with DEBP on initiation setting. Reviewed milk storage, labeling and suggest reading NICU booklet. Mom made aware of O/P services, breastfeeding support groups, community resources, and our phone # for post-discharge questions.          Maternal Data Has patient been taught Hand Expression?: Yes Does the patient have breastfeeding experience prior to this delivery?: No  Feeding Feeding Type: Formula Nipple Type: Slow - flow Length of feed: 30 min  LATCH Score                   Interventions    Lactation Tools Discussed/Used Pump Review: Milk Storage Initiated by:: RN   Consult Status Consult Status: Follow-up Date: 07/04/17 Follow-up type: In-patient    Dahlia ByesBerkelhammer, [facility] 07/03/2017, 7:53 PM

## 2017-07-03 NOTE — Anesthesia Postprocedure Evaluation (Signed)
Anesthesia Post Note  Patient: Shari Skinner  Procedure(s) Performed: AN AD HOC LABOR EPIDURAL     Patient location during evaluation: Mother Baby Anesthesia Type: Epidural Level of consciousness: awake Pain management: pain level controlled Vital Signs Assessment: post-procedure vital signs reviewed and stable Respiratory status: spontaneous breathing Cardiovascular status: stable Postop Assessment: no headache, no backache, epidural receding, patient able to bend at knees, no apparent nausea or vomiting and adequate PO intake Anesthetic complications: no    Last Vitals:  Vitals:   07/02/17 2320 07/03/17 0514  BP: 109/67 (!) 142/75  Pulse: 68 60  Resp: 18 18  Temp: 36.8 C 36.7 C    Last Pain:  Vitals:   07/03/17 0514  TempSrc: Oral  PainSc: 7    Pain Goal: Patients Stated Pain Goal: 8 (07/02/17 1300)               Ventura Leggitt

## 2017-07-03 NOTE — Progress Notes (Signed)
POSTPARTUM PROGRESS NOTE  Post Partum Day #1  Subjective: Shari Skinner is a 22 y.o. G2P1011 s/p SVD at 7153w1d.  No acute events overnight.  Pt denies problems with ambulating, voiding or po intake.  She denies nausea or vomiting.  Pain is moderately controlled with motrin. Can give Percocet 5mg  q6h PRN severe pain.  She has had flatus. She has not had bowel movement.  Lochia Small.   Objective: Blood pressure (!) 142/75, pulse 60, temperature 98 F (36.7 C), temperature source Oral, resp. rate 18, weight 81.2 kg (179 lb), last menstrual period 10/06/2016, unknown if currently breastfeeding.  Physical Exam:  General: Alert, cooperative and no distress Skin: Warm, and dry Heart: Regular rate and rhythm, distal pulses intact Lungs: No respiratory distress, CTAB without wheezing or rales. Abdomen: soft, nontender, BS+ Uterine Fundus: firm, appropriately tender DVT Evaluation: No calf swelling or tenderness Extremities: trace edema  Recent Labs    07/01/17 0737  HGB 12.6  HCT 36.5    Assessment/Plan: Shari Skinner is a 22 y.o. G2P1011 s/p SVD at 4153w1d   PPD#1 - Doing well  Contraception: undecided Feeding: breast Dispo: Plan for discharge 07/04/17.   LOS: 2 days   Claudine Moutonyler Sinthia Karabin 07/03/2017, 7:42 AM

## 2017-07-03 NOTE — Progress Notes (Signed)
Worked with patient on hand expression. Hand expressed about 2 ml's into a bullet. Also talked with mom about the importance of pumping every three hours and the storage of breast milk.

## 2017-07-03 NOTE — Progress Notes (Signed)
Post Partum Day 1 Subjective: no complaints, up ad lib, voiding and tolerating PO  Objective: Blood pressure (!) 142/75, pulse 60, temperature 98 F (36.7 C), temperature source Oral, resp. rate 18, weight 179 lb (81.2 kg), last menstrual period 10/06/2016, unknown if currently breastfeeding.  Physical Exam:  General: alert, cooperative and no distress Lochia: appropriate Uterine Fundus: firm DVT Evaluation: No evidence of DVT seen on physical exam. Negative Homan's sign. No cords or calf tenderness. No significant calf/ankle edema.  Recent Labs    07/01/17 0737  HGB 12.6  HCT 36.5    Assessment/Plan: Plan for discharge tomorrow, Breastfeeding and Lactation consult   LOS: 2 days   Levie HeritageJacob J Michie Molnar 07/03/2017, 8:10 AM

## 2017-07-04 MED ORDER — MEASLES, MUMPS & RUBELLA VAC ~~LOC~~ INJ
0.5000 mL | INJECTION | Freq: Once | SUBCUTANEOUS | Status: AC
  Administered 2017-07-04: 0.5 mL via SUBCUTANEOUS
  Filled 2017-07-04: qty 0.5

## 2017-07-04 MED ORDER — SENNOSIDES-DOCUSATE SODIUM 8.6-50 MG PO TABS: 2.0000 | ORAL_TABLET | ORAL | 0 refills | Status: DC

## 2017-07-04 MED ORDER — IBUPROFEN 600 MG PO TABS: 600.0000 mg | ORAL_TABLET | Freq: Four times a day (QID) | ORAL | 0 refills | Status: DC

## 2017-07-04 NOTE — Lactation Note (Signed)
This note was copied from a baby's chart. Lactation Consultation Note  Patient Name: Shari Tommye Standardmanda Gracy WUJWJ'XToday's Date: 07/04/2017  Mom is pumping and hand expressing every 2-3 hours.  She is obtaining small amounts of colostrum.  She has a medela DEBP at home.  Baby is stable and may be transferred to mom later today.  Discussed asking nurses if she can put baby to breast.  Encouraged to call if assist needed.   Maternal Data    Feeding Feeding Type: Bottle Fed - Formula Nipple Type: Slow - flow Length of feed: 15 min  LATCH Score                   Interventions    Lactation Tools Discussed/Used     Consult Status      Huston FoleyMOULDEN, Tora Prunty S 07/04/2017, 8:42 AM

## 2017-07-04 NOTE — Progress Notes (Signed)
MOB was referred for history of depression/anxiety. * Referral screened out by Clinical Social Worker because none of the following criteria appear to apply: ~ History of anxiety/depression during this pregnancy, or of post-partum depression. ~ Diagnosis of anxiety and/or depression within last 3 years OR * MOB's symptoms currently being treated with medication and/or therapy; MOB currently taking Zoloft.   Please contact the Clinical Social Worker if needs arise, by MOB request, or if MOB scores greater than 9/yes to question 10 on Edinburgh Postpartum Depression Screen.  Shari Skinner, MSW, LCSW Clinical Social Work [phone]    

## 2017-07-04 NOTE — Discharge Summary (Signed)
OB Discharge Summary     Patient Name: Shari Skinner DOB: [birth year] MRN: [id number]  Date of admission: 07/01/2017 Delivering MD: Nigel BridgemanWINBORNE, [place]   Date of discharge: 07/04/2017  Admitting diagnosis: INDUCTION Intrauterine pregnancy: 3767w1d     Secondary diagnosis:  Principal Problem:   Encounter for supervision of low-risk pregnancy, antepartum Active Problems:   Nausea and vomiting during pregnancy prior to [redacted] weeks gestation   Abnormal chromosomal and genetic finding on antenatal screening mother   Poor weight gain of pregnancy   Abnormal ultrasonic finding on antenatal screening of mother, antepartum   Nausea and vomiting during pregnancy   Small for gestational age fetus affecting management of mother   IUGR (intrauterine growth restriction) affecting care of mother   Vaginal delivery  Additional problems: anxiety/depression, Rh negative     Discharge diagnosis: Term Pregnancy Delivered                                                                                                Post partum procedures:none (baby also Rh neg)  Augmentation: Pitocin, Cytotec and Foley Balloon  Complications: None  Hospital course:  Induction of Labor With Vaginal Delivery   22 y.o. yo G2P1011 at 467w1d was admitted to the hospital 07/01/2017 for induction of labor.  Indication for induction: IUGR.  Patient had an uncomplicated labor course as follows: Membrane Rupture Time/Date: 10:08 AM ,07/02/2017   Intrapartum Procedures: Episiotomy: None [1]                                         Lacerations:  2nd degree [3]  Patient had delivery of a Viable infant.  Information for the patient's newborn:  Shari Skinner, Shari Skinner [[id number]][[id number]]  Delivery Method: Vag-Spont   07/02/2017  Details of delivery can be found in separate delivery note.  Patient had a routine postpartum course. Patient is discharged home 07/04/17. On the day of d/c her BP was borderline- will plan for a BP check in 1 wk as a  precaution.  Physical exam  Vitals:   07/03/17 0500 07/03/17 0514 07/03/17 1909 07/04/17 0544  BP:  (!) 142/75 124/73 (!) 147/83  Pulse:  60 100 70  Resp:  18 18   Temp:  98 F (36.7 C) 98.3 F (36.8 C)   TempSrc:  Oral Oral   SpO2:   98%   Weight: 81.2 kg (179 lb)   81.6 kg (180 lb)   General: alert, cooperative and no distress Lochia: appropriate Uterine Fundus: firm Incision: N/A DVT Evaluation: No evidence of DVT seen on physical exam. No cords or calf tenderness. No significant calf/ankle edema. Labs: Lab Results  Component Value Date   WBC 11.1 (H) 07/01/2017   HGB 12.6 07/01/2017   HCT 36.5 07/01/2017   MCV 93.1 07/01/2017   PLT 153 07/01/2017   CMP Latest Ref Rng & Units 06/21/2017  Glucose 65 - 99 mg/dL 81  BUN 7 - 25 mg/dL 10  Creatinine 5.620.50 - 1.301.10 mg/dL 8.650.63  Sodium  135 - 146 mmol/L 138  Potassium 3.5 - 5.3 mmol/L 4.6  Chloride 98 - 110 mmol/L 106  CO2 20 - 32 mmol/L 26  Calcium 8.6 - 10.2 mg/dL 8.6  Total Protein 6.1 - 8.1 g/dL 1.6(X)  Total Bilirubin 0.2 - 1.2 mg/dL 0.3  AST 10 - 30 U/L 17  ALT 6 - 29 U/L 10    Discharge instruction: per After Visit Summary and "Baby and Me Booklet".  After visit meds:  Allergies as of 07/04/2017      Reactions   Amoxicillin Other (See Comments)   Not sure of reaction- maybe a rash- childhood allergy Has patient had a PCN reaction causing immediate rash, facial/tongue/throat swelling, SOB or lightheadedness with hypotension: Unknown Has patient had a PCN reaction causing severe rash involving mucus membranes or skin necrosis: Unknown Has patient had a PCN reaction that required hospitalization: Unknown Has patient had a PCN reaction occurring within the last 10 years: No If all of the above answers are "NO", then may proceed with Cephalospor   Clindamycin/lincomycin Other (See Comments)   Not sure of reaction- maybe rash   Hydrocodone-acetaminophen Rash   Strawberry Leaves Extract Rash   Fruit (strawberry)       Medication List    STOP taking these medications   Doxylamine-Pyridoxine 10-10 MG Tbec Commonly known as:  DICLEGIS   metoCLOPramide 10 MG tablet Commonly known as:  REGLAN   promethazine 25 MG suppository Commonly known as:  PHENERGAN     TAKE these medications   calcium carbonate 500 MG chewable tablet Commonly known as:  TUMS - dosed in mg elemental calcium Chew 1 tablet by mouth 2 (two) times daily as needed for indigestion or heartburn.   ibuprofen 600 MG tablet Commonly known as:  ADVIL,MOTRIN Take 1 tablet (600 mg total) by mouth every 6 (six) hours.   prenatal vitamin w/FE, FA 27-1 MG Tabs tablet Take 1 tablet by mouth daily at 12 noon.   senna-docusate 8.6-50 MG tablet Commonly known as:  Senokot-S Take 2 tablets by mouth daily. Start taking on:  07/05/2017   sertraline 25 MG tablet Commonly known as:  ZOLOFT Take 1 tablet (25 mg total) by mouth daily.       Diet: routine diet  Activity: Advance as tolerated. Pelvic rest for 6 weeks.   Outpatient follow up:1 week BP check, then 4 wk PP visit Follow up Appt: Future Appointments  Date Time Provider Department Center  08/01/2017  1:30 PM Allie Bossier, MD [facility]   Follow up Visit:No Follow-up on file. Follow-up Information    [facility] [place]. Schedule an appointment as soon as possible for a visit in 4 week(s).   Specialty:  Obstetrics and Gynecology Why:  1 week BP check and Follow up in 4 weeks Contact information: [address] [place] [address] [phone]          Postpartum contraception: Nexplanon  Newborn Data: Live born female  Birth Weight: 5 lb 0.4 oz (2279 g) APGAR: 9, 9  Newborn Delivery   Birth date/time:  07/02/2017 15:53:00 Delivery type:  Vaginal, Spontaneous     Baby Feeding: Breast Disposition:NICU   07/04/2017 Oralia Manis, DO  PGY-1  CNM attestation I have seen and examined this patient  and agree with above documentation in the resident's note.   Shari Skinner is a 22 y.o. G2P1011 s/p IOL for IUGR and then SVD.   Pain is well controlled.  Plan for birth control is Nexplanon.  Method of Feeding: pumping  PE:  BP (!) 147/83 (BP Location: Left Arm)   Pulse 70   Temp 98.3 F (36.8 C) (Oral)   Resp 18   Wt 81.6 kg (180 lb)   LMP 10/06/2016 (Exact Date)   SpO2 98%   Breastfeeding? Unknown   BMI 31.89 kg/m  Fundus firm  No results for input(s): HGB, HCT in the last 72 hours.   Plan: discharge today - postpartum care discussed - f/u clinic in 1 wk for BP check, then 4 weeks for postpartum visit   Cam Hai, CNM 9:29 AM 07/04/2017

## 2017-07-04 NOTE — Discharge Instructions (Signed)

## 2017-07-04 NOTE — Progress Notes (Signed)
Patient screened out for psychosocial assessment since none of the following apply: °Psychosocial stressors documented in mother or baby's chart °Gestation less than 32 weeks °Code at delivery  °Infant with anomalies °Please contact the Clinical Social Worker if specific needs arise, by MOB's request, or if MOB scores greater than 9/yes to question 10 on Edinburgh Postpartum Depression Screen. ° °Sonika Levins Boyd-Gilyard, MSW, LCSW °Clinical Social Work °[phone] °  °

## 2017-07-05 ENCOUNTER — Inpatient Hospital Stay (HOSPITAL_COMMUNITY): Admission: RE | Admit: 2017-07-05 | Payer: Medicaid Other | Source: Ambulatory Visit

## 2017-07-05 ENCOUNTER — Encounter: Payer: Self-pay | Admitting: Certified Nurse Midwife

## 2017-07-05 ENCOUNTER — Inpatient Hospital Stay (HOSPITAL_COMMUNITY)
Admission: AD | Admit: 2017-07-05 | Discharge: 2017-07-05 | Disposition: A | Discharge status: Home / Self Care | Payer: Medicaid Other | Source: Ambulatory Visit | Attending: Obstetrics and Gynecology | Admitting: Obstetrics and Gynecology

## 2017-07-05 ENCOUNTER — Encounter (HOSPITAL_COMMUNITY): Payer: Self-pay

## 2017-07-05 ENCOUNTER — Other Ambulatory Visit: Payer: Self-pay

## 2017-07-05 ENCOUNTER — Ambulatory Visit (INDEPENDENT_AMBULATORY_CARE_PROVIDER_SITE_OTHER): Payer: Medicaid Other | Admitting: Certified Nurse Midwife

## 2017-07-05 ENCOUNTER — Encounter: Payer: Medicaid Other | Admitting: Certified Nurse Midwife

## 2017-07-05 VITALS — BP 146/91 | Resp 16 | Ht 64.0 in | Wt 177.0 lb

## 2017-07-05 DIAGNOSIS — Z880 Allergy status to penicillin: Secondary | ICD-10-CM | POA: Diagnosis not present

## 2017-07-05 DIAGNOSIS — R600 Localized edema: Secondary | ICD-10-CM

## 2017-07-05 DIAGNOSIS — R030 Elevated blood-pressure reading, without diagnosis of hypertension: Secondary | ICD-10-CM | POA: Diagnosis not present

## 2017-07-05 DIAGNOSIS — Z87891 Personal history of nicotine dependence: Secondary | ICD-10-CM | POA: Diagnosis not present

## 2017-07-05 DIAGNOSIS — O165 Unspecified maternal hypertension, complicating the puerperium: Secondary | ICD-10-CM

## 2017-07-05 DIAGNOSIS — Z79899 Other long term (current) drug therapy: Secondary | ICD-10-CM | POA: Insufficient documentation

## 2017-07-05 DIAGNOSIS — G44209 Tension-type headache, unspecified, not intractable: Secondary | ICD-10-CM

## 2017-07-05 DIAGNOSIS — R51 Headache: Secondary | ICD-10-CM | POA: Insufficient documentation

## 2017-07-05 DIAGNOSIS — Z885 Allergy status to narcotic agent status: Secondary | ICD-10-CM | POA: Diagnosis not present

## 2017-07-05 DIAGNOSIS — Z881 Allergy status to other antibiotic agents status: Secondary | ICD-10-CM | POA: Diagnosis not present

## 2017-07-05 LAB — TYPE AND SCREEN
ABO/RH(D): O NEG
Antibody Screen: POSITIVE
UNIT DIVISION: 0
UNIT DIVISION: 0

## 2017-07-05 LAB — BPAM RBC
BLOOD PRODUCT EXPIRATION DATE: 201902212359
Blood Product Expiration Date: 201902252359
Unit Type and Rh: 9500
Unit Type and Rh: 9500

## 2017-07-05 LAB — COMPREHENSIVE METABOLIC PANEL
ALT: 14 U/L (ref 14–54)
ANION GAP: 7 (ref 5–15)
AST: 22 U/L (ref 15–41)
Albumin: 2.6 g/dL — ABNORMAL LOW (ref 3.5–5.0)
Alkaline Phosphatase: 159 U/L — ABNORMAL HIGH (ref 38–126)
BUN: 10 mg/dL (ref 6–20)
CHLORIDE: 105 mmol/L (ref 101–111)
CO2: 24 mmol/L (ref 22–32)
CREATININE: 0.61 mg/dL (ref 0.44–1.00)
Calcium: 8.4 mg/dL — ABNORMAL LOW (ref 8.9–10.3)
GFR calc non Af Amer: >60 mL/min (ref >60)
Glucose, Bld: 89 mg/dL (ref 65–99)
Potassium: 4.4 mmol/L (ref 3.5–5.1)
SODIUM: 136 mmol/L (ref 135–145)
Total Bilirubin: 0.3 mg/dL (ref 0.3–1.2)
Total Protein: 5.9 g/dL — ABNORMAL LOW (ref 6.5–8.1)

## 2017-07-05 LAB — CBC
HCT: 34.3 % — ABNORMAL LOW (ref 36.0–46.0)
Hemoglobin: 11.4 g/dL — ABNORMAL LOW (ref 12.0–15.0)
MCH: 31.5 pg (ref 26.0–34.0)
MCHC: 33.2 g/dL (ref 30.0–36.0)
MCV: 94.8 fL (ref 78.0–100.0)
PLATELETS: 158 10*3/uL (ref 150–400)
RBC: 3.62 MIL/uL — AB (ref 3.87–5.11)
RDW: 12.7 % (ref 11.5–15.5)
WBC: 9.2 10*3/uL (ref 4.0–10.5)

## 2017-07-05 LAB — PROTEIN / CREATININE RATIO, URINE
Creatinine, Urine: 54 mg/dL
PROTEIN CREATININE RATIO: 0.11 mg/mg{creat} (ref 0.00–0.15)
TOTAL PROTEIN, URINE: 6 mg/dL

## 2017-07-05 MED ORDER — HYDROCHLOROTHIAZIDE 12.5 MG PO TABS: 12.5000 mg | ORAL_TABLET | Freq: Every day | ORAL | 0 refills | Status: DC

## 2017-07-05 MED ORDER — BUTALBITAL-APAP-CAFFEINE 50-325-40 MG PO TABS: 1.0000 | ORAL_TABLET | Freq: Four times a day (QID) | ORAL | 0 refills | Status: DC | PRN

## 2017-07-05 MED ORDER — ZOLPIDEM TARTRATE 5 MG PO TABS: 5.0000 mg | ORAL_TABLET | Freq: Every evening | ORAL | 0 refills | Status: DC | PRN

## 2017-07-05 MED ORDER — IBUPROFEN 600 MG PO TABS
600.0000 mg | ORAL_TABLET | Freq: Once | ORAL | Status: AC
  Administered 2017-07-05: 600 mg via ORAL
  Filled 2017-07-05: qty 1

## 2017-07-05 NOTE — Progress Notes (Signed)
BLOOD PRESSURE CHECK ENCOUNTER NOTE  Subjective:   Shari Skinner is a 22 y.o. 352P1011 female here for BP check. She is 3 days postpartum from a vaginal delivery. She currently reports a headache, rates pain 5/10- not relieved by ibuprofen, upper right abdominal pain that started yesterday, dizziness and increased ankle swelling. She denies vision changes. She denies issues with hypertension antepartum or intrapartum, denies being on medication for hypertension or receiving any medication during labor.    Obstetric History OB History  Gravida Para Term Preterm AB Living  2 1 1   1 1   SAB TAB Ectopic Multiple Live Births  1     0 1    # Outcome Date GA Lbr Len/2nd Weight Sex Delivery Anes PTL Lv  2 Term 07/02/17 7557w1d 07:11 / 00:12 5 lb 0.4 oz (2.279 kg) M Vag-Spont EPI, Local  LIV     Birth Comments: WNL  1 SAB 2014              Past Medical History:  Diagnosis Date  . Anxiety   . Depression     Past Surgical History:  Procedure Laterality Date  . APPENDECTOMY  2013  . WISDOM TOOTH EXTRACTION      Current Outpatient Medications on File Prior to Visit  Medication Sig Dispense Refill  . calcium carbonate (TUMS - DOSED IN MG ELEMENTAL CALCIUM) 500 MG chewable tablet Chew 1 tablet by mouth 2 (two) times daily as needed for indigestion or heartburn.    Marland Kitchen. ibuprofen (ADVIL,MOTRIN) 600 MG tablet Take 1 tablet (600 mg total) by mouth every 6 (six) hours. (Patient not taking: Reported on 07/05/2017) 30 tablet 0  . prenatal vitamin w/FE, FA (PRENATAL 1 + 1) 27-1 MG TABS tablet Take 1 tablet by mouth daily at 12 noon.    . senna-docusate (SENOKOT-S) 8.6-50 MG tablet Take 2 tablets by mouth daily. (Patient not taking: Reported on 07/05/2017) 60 tablet 0  . sertraline (ZOLOFT) 25 MG tablet Take 1 tablet (25 mg total) by mouth daily. (Patient not taking: Reported on 06/07/2017) 30 tablet 1   No current facility-administered medications on file prior to visit.     Allergies  Allergen  Reactions  . Amoxicillin Other (See Comments)    Not sure of reaction- maybe a rash- childhood allergy Has patient had a PCN reaction causing immediate rash, facial/tongue/throat swelling, SOB or lightheadedness with hypotension: Unknown Has patient had a PCN reaction causing severe rash involving mucus membranes or skin necrosis: Unknown Has patient had a PCN reaction that required hospitalization: Unknown Has patient had a PCN reaction occurring within the last 10 years: No If all of the above answers are "NO", then may proceed with Cephalospor  . Clindamycin/Lincomycin Other (See Comments)    Not sure of reaction- maybe rash  . Hydrocodone-Acetaminophen Rash  . Strawberry Leaves Extract Rash    Fruit (strawberry)    Social History   Socioeconomic History  . Marital status: Single    Spouse name: Not on file  . Number of children: Not on file  . Years of education: Not on file  . Highest education level: Not on file  Social Needs  . Financial resource strain: Not on file  . Food insecurity - worry: Not on file  . Food insecurity - inability: Not on file  . Transportation needs - medical: Not on file  . Transportation needs - non-medical: Not on file  Occupational History  . Not on file  Tobacco  Use  . Smoking status: Former Smoker    Packs/day: 0.50    Years: 3.00    Pack years: 1.50    Types: Cigarettes    Last attempt to quit: 09/25/2016    Years since quitting: 0.7  . Smokeless tobacco: Never Used  Substance and Sexual Activity  . Alcohol use: No  . Drug use: No  . Sexual activity: Not Currently    Birth control/protection: None  Other Topics Concern  . Not on file  Social History Narrative  . Not on file    Family History  Problem Relation Age of Onset  . Diabetes Mother   . Diabetes Father     Objective:  BP (!) 146/91   Resp 16   Ht 5\' 4"  (1.626 m)   Wt 177 lb (80.3 kg)   LMP 10/06/2016 (Exact Date)   BMI 30.38 kg/m  CONSTITUTIONAL:  Well-developed, well-nourished female in no acute distress.  NEUROLOGIC: Alert and oriented to person, place, and time. Normal reflexes, muscle tone coordination. No cranial nerve deficit noted. PSYCHIATRIC: Normal mood and affect. Normal behavior. Normal judgment and thought content. CARDIOVASCULAR: Normal heart rate noted, regular rhythm RESPIRATORY: Clear to auscultation bilaterally. Effort and breath sounds normal, no problems with respiration noted. ABDOMEN: Soft, normal bowel sounds, no distention noted.  Tenderness noted in upper right abdomen. MUSCULOSKELETAL: Normal range of motion. No tenderness.  +2 pitting edema of ankle and +1 pedal edema. + 1 DTR. No clonus noted.    Assessment and Plan:  1. Postpartum hypertension Sent to MAU for preeclampsia workup based on s/s   [name], [place] 07/05/17, 11:03 AM

## 2017-07-05 NOTE — Discharge Instructions (Signed)
Follow-up in clinic in a week for BP recheck Medications given for headache, sleep, and swelling  Tension Headache A tension headache is pain, pressure, or aching that is felt over the front and sides of your head. These headaches can last from 30 minutes to several days. Follow these instructions at home: Managing pain  Take over-the-counter and prescription medicines only as told by your doctor.  Lie down in a dark, quiet room when you have a headache.  If directed, apply ice to your head and neck area: ? Put ice in a plastic bag. ? Place a towel between your skin and the bag. ? Leave the ice on for 20 minutes, 2-3 times per day.  Use a heating pad or a hot shower to apply heat to your head and neck area as told by your doctor. Eating and drinking  Eat meals on a regular schedule.  Do not drink a lot of alcohol.  Do not use a lot of caffeine, or stop using caffeine. General instructions  Keep all follow-up visits as told by your doctor. This is important.  Keep a journal to find out if certain things bring on headaches. For example, write down: ? What you eat and drink. ? How much sleep you get. ? Any change to your diet or medicines.  Try getting a massage, or doing other things that help you to relax.  Lessen stress.  Sit up straight. Do not tighten (tense) your muscles.  Do not use tobacco products. This includes cigarettes, chewing tobacco, or e-cigarettes. If you need help quitting, ask your doctor.  Exercise regularly as told by your doctor.  Get enough sleep. This may mean 7-9 hours of sleep. Contact a doctor if:  Your symptoms are not helped by medicine.  You have a headache that feels different from your usual headache.  You feel sick to your stomach (nauseous) or you throw up (vomit).  You have a fever. Get help right away if:  Your headache becomes very bad.  You keep throwing up.  You have a stiff neck.  You have trouble seeing.  You have  trouble speaking.  You have pain in your eye or ear.  Your muscles are weak or you lose muscle control.  You lose your balance or you have trouble walking.  You feel like you will pass out (faint) or you pass out.  You have confusion. This information is not intended to replace advice given to you by your health care provider. Make sure you discuss any questions you have with your health care provider. Document Released: 08/08/2009 Document Revised: 01/12/2016 Document Reviewed: 09/06/2014 Elsevier Interactive Patient Education  [employer]2018 [employer]

## 2017-07-05 NOTE — MAU Note (Signed)
Pt discharged home from hospital yesterday. Pt went to the office this morning and had increased BP. Pt reports headache.

## 2017-07-05 NOTE — MAU Provider Note (Signed)
History     CSN: [id number]  Arrival date and time: 07/05/17 1156   None     Chief Complaint  Patient presents with  . PP headache, high BP   HPI Kaula Klenke is a 22 y.o. G2P1011 who is 3 days postpartum who presents to the MAU for evaluation for preeclampsia.  Patient was seen in the office today for blood pressure check.  Blood pressure was noted to be elevated.  Patient was also endorsing headache and vision changes.  Patient states that she has a headache that is 5 out of 10.  She has been taking her 600 mg ibuprofen with minimal relief.  Headache is located in the frontal region and i irritating in nature.  She did not feel bothered by the headache.  She states that she is also been having spotty changes in her vision for the last month of her pregnancy.  Has told previous providers were not concerned.  Patient's also had some postpartum lower extremity swelling.  She denies any fevers, abdominal pain.  Patient discharged from the hospital yesterday.  Mildly elevated blood pressures at that time.  Patient did not have any high blood pressures antepartum or intrapartum.  She admits to sleeping poorly and being stressed about her baby which is still in the NICU.  Past Medical History:  Diagnosis Date  . Anxiety   . Depression     Past Surgical History:  Procedure Laterality Date  . APPENDECTOMY  2013  . WISDOM TOOTH EXTRACTION      Family History  Problem Relation Age of Onset  . Diabetes Mother   . Diabetes Father     Social History   Tobacco Use  . Smoking status: Former Smoker    Packs/day: 0.50    Years: 3.00    Pack years: 1.50    Types: Cigarettes    Last attempt to quit: 09/25/2016    Years since quitting: 0.7  . Smokeless tobacco: Never Used  Substance Use Topics  . Alcohol use: No  . Drug use: No    Allergies:  Allergies  Allergen Reactions  . Amoxicillin Other (See Comments)    Not sure of reaction- maybe a rash- childhood allergy Has patient  had a PCN reaction causing immediate rash, facial/tongue/throat swelling, SOB or lightheadedness with hypotension: Unknown Has patient had a PCN reaction causing severe rash involving mucus membranes or skin necrosis: Unknown Has patient had a PCN reaction that required hospitalization: Unknown Has patient had a PCN reaction occurring within the last 10 years: No If all of the above answers are "NO", then may proceed with Cephalospor  . Clindamycin/Lincomycin Other (See Comments)    Not sure of reaction- maybe rash  . Hydrocodone-Acetaminophen Rash  . Strawberry Leaves Extract Rash    Fruit (strawberry)    Medications Prior to Admission  Medication Sig Dispense Refill Last Dose  . calcium carbonate (TUMS - DOSED IN MG ELEMENTAL CALCIUM) 500 MG chewable tablet Chew 1 tablet by mouth 2 (two) times daily as needed for indigestion or heartburn.   Not Taking  . ibuprofen (ADVIL,MOTRIN) 600 MG tablet Take 1 tablet (600 mg total) by mouth every 6 (six) hours. (Patient not taking: Reported on 07/05/2017) 30 tablet 0 Not Taking  . prenatal vitamin w/FE, FA (PRENATAL 1 + 1) 27-1 MG TABS tablet Take 1 tablet by mouth daily at 12 noon.   Not Taking  . senna-docusate (SENOKOT-S) 8.6-50 MG tablet Take 2 tablets by mouth daily. (Patient not  taking: Reported on 07/05/2017) 60 tablet 0 Not Taking  . sertraline (ZOLOFT) 25 MG tablet Take 1 tablet (25 mg total) by mouth daily. (Patient not taking: Reported on 06/07/2017) 30 tablet 1 Not Taking    Review of Systems  All systems reviewed and are negative for acute change except as noted in the HPI.  Physical Exam   Blood pressure 119/82, pulse 73, temperature 98.5 F (36.9 C), temperature source Oral, resp. rate 16, weight 80.7 kg (178 lb), SpO2 99 %, unknown if currently breastfeeding.  Physical Exam  Nursing note and vitals reviewed. Constitutional: She is oriented to person, place, and time. She appears well-developed and well-nourished. No distress.  HENT:   Head: Normocephalic and atraumatic.  Mouth/Throat: Oropharynx is clear and moist.  Eyes: Conjunctivae and EOM are normal. Pupils are equal, round, and reactive to light.  Neck: Normal range of motion.  Cardiovascular: Normal rate and regular rhythm.  Respiratory: Effort normal and breath sounds normal.  GI: Soft. She exhibits no distension. There is no tenderness.  Musculoskeletal: Normal range of motion. She exhibits edema. She exhibits no tenderness.  Neurological: She is alert and oriented to person, place, and time.  Skin: Skin is warm and dry.  Psychiatric:  Tearful, Anxious   Results for orders placed or performed during the hospital encounter of 07/05/17  CBC  Result Value Ref Range   WBC 9.2 4.0 - 10.5 K/uL   RBC 3.62 (L) 3.87 - 5.11 MIL/uL   Hemoglobin 11.4 (L) 12.0 - 15.0 g/dL   HCT 16.134.3 (L) 09.636.0 - 04.546.0 %   MCV 94.8 78.0 - 100.0 fL   MCH 31.5 26.0 - 34.0 pg   MCHC 33.2 30.0 - 36.0 g/dL   RDW 40.912.7 81.111.5 - 91.415.5 %   Platelets 158 150 - 400 K/uL  Comprehensive metabolic panel  Result Value Ref Range   Sodium 136 135 - 145 mmol/L   Potassium 4.4 3.5 - 5.1 mmol/L   Chloride 105 101 - 111 mmol/L   CO2 24 22 - 32 mmol/L   Glucose, Bld 89 65 - 99 mg/dL   BUN 10 6 - 20 mg/dL   Creatinine, Ser 7.820.61 0.44 - 1.00 mg/dL   Calcium 8.4 (L) 8.9 - 10.3 mg/dL   Total Protein 5.9 (L) 6.5 - 8.1 g/dL   Albumin 2.6 (L) 3.5 - 5.0 g/dL   AST 22 15 - 41 U/L   ALT 14 14 - 54 U/L   Alkaline Phosphatase 159 (H) 38 - 126 U/L   Total Bilirubin 0.3 0.3 - 1.2 mg/dL   GFR calc non Af Amer >60 >60 mL/min   GFR calc Af Amer >60 >60 mL/min   Anion gap 7 5 - 15  Protein / creatinine ratio, urine  Result Value Ref Range   Creatinine, Urine 54.00 mg/dL   Total Protein, Urine 6 mg/dL   Protein Creatinine Ratio 0.11 0.00 - 0.15 mg/mg[Cre]    MAU Course  Procedures  MDM BPs mildly elevated PIH labs wnl Symptoms do not sound like typical PIH; believe patient is stressed and anxious at baseline  with poor sleep contributing to a tension headache Treatment in MAU: Tylenol  Reviewed case with Dr. Vergie LivingPickens whom was in agreement with plan  Assessment and Plan  Elevated blood pressure reading  Acute non intractable tension-type headache  Localized edema  -Discharge home in stable condition -Rx for HCTZ given to help with swelling -Rx for Ambien to help with sleep -Fioricet given for headache -  Return precautions given -Handout provided -Note sent to clinic for follow-up in 1 week   Caryl Ada, DO 07/05/2017, 12:45 PM

## 2017-07-12 ENCOUNTER — Ambulatory Visit (HOSPITAL_COMMUNITY): Payer: Medicaid Other

## 2017-07-12 ENCOUNTER — Ambulatory Visit: Payer: Medicaid Other | Admitting: Certified Nurse Midwife

## 2017-08-01 ENCOUNTER — Encounter: Payer: Self-pay | Admitting: Obstetrics & Gynecology

## 2017-08-01 ENCOUNTER — Ambulatory Visit (INDEPENDENT_AMBULATORY_CARE_PROVIDER_SITE_OTHER): Payer: Medicaid Other | Admitting: Obstetrics & Gynecology

## 2017-08-01 VITALS — BP 114/66 | HR 84 | Wt 161.0 lb

## 2017-08-01 DIAGNOSIS — Z1389 Encounter for screening for other disorder: Secondary | ICD-10-CM | POA: Diagnosis not present

## 2017-08-01 DIAGNOSIS — Z3049 Encounter for surveillance of other contraceptives: Secondary | ICD-10-CM

## 2017-08-01 DIAGNOSIS — Z3202 Encounter for pregnancy test, result negative: Secondary | ICD-10-CM

## 2017-08-01 DIAGNOSIS — Z30017 Encounter for initial prescription of implantable subdermal contraceptive: Secondary | ICD-10-CM

## 2017-08-01 LAB — POCT URINE PREGNANCY: Preg Test, Ur: NEGATIVE

## 2017-08-01 MED ORDER — FLUOXETINE HCL 20 MG PO TABS: 20.0000 mg | ORAL_TABLET | Freq: Every day | ORAL | 6 refills | Status: DC

## 2017-08-01 MED ORDER — ETONOGESTREL 68 MG ~~LOC~~ IMPL
68.0000 mg | DRUG_IMPLANT | Freq: Once | SUBCUTANEOUS | Status: AC
  Administered 2017-08-01: 68 mg via SUBCUTANEOUS

## 2017-08-01 NOTE — Progress Notes (Signed)
Post Partum Exam  Shari Skinner is a 22 y.o. 772P1011 female who presents for a postpartum visit. She is 4 weeks postpartum following a spontaneous vaginal delivery. I have fully reviewed the prenatal and intrapartum course. The delivery was at 37 gestational weeks. She had IOL for IUGR.  Anesthesia: epidural. Postpartum course has been unremarkable. Baby's course has been unremarkable. Baby is feeding by bottle - Lucien MonsGer[facility] [employer]. Bleeding no bleeding. Bowel function is normal. Bladder function is normal. Patient is not sexually active. Contraception method is Nexplanon. Postpartum depression screening:pos (18) She has a history of depression. She has tried "several" meds in the past. She finds it hard to get out of bed in the mornings. She denies HI/SI.  The following portions of the patient's history were reviewed and updated as appropriate: allergies, current medications, past family history, past medical history, past social history, past surgical history and problem list.   Review of Systems Pertinent items are noted in HPI.    Objective:  unknown if currently breastfeeding.  General:  alert   Breasts:  inspection negative, no nipple discharge or bleeding, no masses or nodularity palpable  Lungs: clear to auscultation bilaterally  Heart:  regular rate and rhythm, S1, S2 normal, no murmur, click, rub or gallop  Abdomen: soft, non-tender; bowel sounds normal; no masses,  no organomegaly   Vulva:  not evaluated  Vagina: not evaluated  Cervix:  not evaluated  Corpus: not examined  Adnexa:  not evaluated  Rectal Exam: Not performed.        UPT was negative. Consent was signed. Time out procedure was done. Her left arm was prepped with betadine and infiltrated with 3 cc of 1% lidocaine. After adequate anesthesia was assured, the Nexplanon device was placed according to standard of care. Her arm was hemostatic and was bandaged. She tolerated the procedure well. Assessment:    Normalpostpartum exam. Pap smear not done at today's visit.   Plan:   1. Contraception: Nexplanon 2. Postpartum depression- trial of prozac 20 mg Q AM If no better 3. Follow up in: 4 weeks or as needed. For annual and follow up on her depression.

## 2017-08-12 ENCOUNTER — Telehealth: Payer: Self-pay | Admitting: Obstetrics & Gynecology

## 2017-08-12 MED ORDER — ACYCLOVIR 400 MG PO TABS: 400.0000 mg | ORAL_TABLET | Freq: Three times a day (TID) | ORAL | 1 refills | Status: DC

## 2017-08-12 NOTE — Telephone Encounter (Signed)
RX for Acyclovir 400 mg TID for 7 days sent to [facility].  Pt has cold sores and the baby's pediatrician requested that we give her an antiviral for it.

## 2017-08-12 NOTE — Telephone Encounter (Signed)
Pt uses [facility]. Pt desire meds or treatment for cold sores on lips. Pt ph [phone]. Pediatrician advised pt to call here.

## 2017-08-29 ENCOUNTER — Ambulatory Visit (INDEPENDENT_AMBULATORY_CARE_PROVIDER_SITE_OTHER): Payer: Medicaid Other | Admitting: Obstetrics & Gynecology

## 2017-08-29 ENCOUNTER — Other Ambulatory Visit (HOSPITAL_COMMUNITY)
Admission: RE | Admit: 2017-08-29 | Discharge: 2017-08-29 | Disposition: A | Discharge status: Home / Self Care | Payer: Medicaid Other | Source: Ambulatory Visit | Attending: Obstetrics & Gynecology | Admitting: Obstetrics & Gynecology

## 2017-08-29 ENCOUNTER — Encounter: Payer: Self-pay | Admitting: Obstetrics & Gynecology

## 2017-08-29 VITALS — BP 108/71 | HR 75 | Ht 63.0 in | Wt 170.0 lb

## 2017-08-29 DIAGNOSIS — Z01419 Encounter for gynecological examination (general) (routine) without abnormal findings: Secondary | ICD-10-CM

## 2017-08-29 DIAGNOSIS — K921 Melena: Secondary | ICD-10-CM

## 2017-08-29 DIAGNOSIS — Z0000 Encounter for general adult medical examination without abnormal findings: Secondary | ICD-10-CM

## 2017-08-29 MED ORDER — METRONIDAZOLE 500 MG PO TABS: 500.0000 mg | ORAL_TABLET | Freq: Two times a day (BID) | ORAL | 3 refills | Status: DC

## 2017-08-29 NOTE — Progress Notes (Signed)
Pt notices blood when she has a bowel movement . Postpartum Depression screen is a 14 (was 18 four weeks ago)

## 2017-08-29 NOTE — Progress Notes (Addendum)
Subjective:    Shari Skinner is a 22 y.o. single P49 (2 months old son) female who presents for an annual exam. The patient has no complaints today. She reports that the prozac I prescribed abotu 2 weeks ago has helped. She does not feel the need for a psychiatrist at this time. She reports new onset bleeding with BMs. The patient is sexually active. GYN screening history: no prior history of gyn screening tests. The patient wears seatbelts: yes. The patient participates in regular exercise: yes. Has the patient ever been transfused or tattooed?: yes. The patient reports that there is not domestic violence in her life.   Menstrual History: OB History    Gravida  2   Para  1   Term  1   Preterm      AB  1   Living  1     SAB  1   TAB      Ectopic      Multiple  0   Live Births  1           Menarche age: 89 No LMP recorded.    The following portions of the patient's history were reviewed and updated as appropriate: allergies, current medications, past family history, past medical history, past social history, past surgical history and problem list.  Review of Systems Pertinent items are noted in HPI.   Fh- no breast/gyn/colon cancer Monogamous for about 1 1/2 years  Denies dyspareunia Uses Nexplanon, it was placed about 2 weeks ago. She has occasional spotting. She had one for 3 years in the past.   Objective:    BP 108/71   Pulse 75   Ht 5\' 3"  (1.6 m)   Wt 170 lb (77.1 kg)   Breastfeeding? No   BMI 30.11 kg/m   General Appearance:    Alert, cooperative, no distress, appears stated age  Head:    Normocephalic, without obvious abnormality, atraumatic  Eyes:    PERRL, conjunctiva/corneas clear, EOM's intact, fundi    benign, both eyes  Ears:    Normal TM's and external ear canals, both ears  Nose:   Nares normal, septum midline, mucosa normal, no drainage    or sinus tenderness  Throat:   Lips, mucosa, and tongue normal; teeth and gums normal  Neck:    Supple, symmetrical, trachea midline, no adenopathy;    thyroid:  no enlargement/tenderness/nodules; no carotid   bruit or JVD  Back:     Symmetric, no curvature, ROM normal, no CVA tenderness  Lungs:     Clear to auscultation bilaterally, respirations unlabored  Chest Wall:    No tenderness or deformity   Heart:    Regular rate and rhythm, S1 and S2 normal, no murmur, rub   or gallop  Breast Exam:    No tenderness, masses, or nipple abnormality  Abdomen:     Soft, non-tender, bowel sounds active all four quadrants,    no masses, no organomegaly  Genitalia:    Normal female without lesion or tenderness, discharge c/w BV, normal size and shape, anteverted, mobile, non-tender, normal adnexal exam      Extremities:   Extremities normal, atraumatic, no cyanosis or edema  Pulses:   2+ and symmetric all extremities  Skin:   Skin color, texture, turgor normal, no rashes or lesions  Lymph nodes:   Cervical, supraclavicular, and axillary nodes normal  Neurologic:   CNII-XII intact, normal strength, sensation and reflexes    throughout  .  Assessment:    Healthy female exam.   hematochezia   Plan:     Chlamydia specimen. GC specimen. Thin prep Pap smear.   Treat with flagyl Refer to GI

## 2017-08-29 NOTE — Addendum Note (Signed)
Addended by: Allie BossierVE, Aiko Belko C on: 08/29/2017 02:36 PM   Modules accepted: Orders

## 2017-08-30 ENCOUNTER — Telehealth: Payer: Self-pay

## 2017-08-30 LAB — TSH: TSH: 1.26 mIU/L

## 2017-08-30 NOTE — Telephone Encounter (Signed)
Attempted to call pt with questions about FMLA Form/ Return to Work Form she dropped off. PT did not answer and I was unable to leave a message due to voicemail box not being set up.

## 2017-09-02 LAB — CYTOLOGY - PAP
CHLAMYDIA, DNA PROBE: NEGATIVE
DIAGNOSIS: NEGATIVE
Neisseria Gonorrhea: NEGATIVE

## 2018-04-17 ENCOUNTER — Ambulatory Visit: Payer: Self-pay | Admitting: Family Medicine

## 2018-04-17 ENCOUNTER — Encounter: Payer: Self-pay | Admitting: Family Medicine

## 2018-04-17 ENCOUNTER — Other Ambulatory Visit: Payer: Self-pay | Admitting: *Deleted

## 2018-04-17 VITALS — BP 115/70 | HR 79 | Ht 66.0 in | Wt 175.0 lb

## 2018-04-17 DIAGNOSIS — Z3009 Encounter for other general counseling and advice on contraception: Secondary | ICD-10-CM

## 2018-04-17 DIAGNOSIS — N898 Other specified noninflammatory disorders of vagina: Secondary | ICD-10-CM

## 2018-04-17 DIAGNOSIS — Z113 Encounter for screening for infections with a predominantly sexual mode of transmission: Secondary | ICD-10-CM

## 2018-04-17 DIAGNOSIS — N760 Acute vaginitis: Secondary | ICD-10-CM

## 2018-04-17 DIAGNOSIS — B9689 Other specified bacterial agents as the cause of diseases classified elsewhere: Secondary | ICD-10-CM

## 2018-04-17 DIAGNOSIS — R102 Pelvic and perineal pain: Secondary | ICD-10-CM

## 2018-04-17 MED ORDER — DOXYCYCLINE HYCLATE 100 MG PO CAPS: 100.0000 mg | ORAL_CAPSULE | Freq: Two times a day (BID) | ORAL | 0 refills | Status: DC

## 2018-04-17 NOTE — Patient Instructions (Signed)

## 2018-04-17 NOTE — Progress Notes (Signed)
    Subjective:    Patient ID: Shari Skinner is a 22 y.o. female presenting with No chief complaint on file.  on 04/17/2018  HPI: Has Nexplanon in--second one. In x 9 months. Having a lot of bleeding. This was not an issue last time.Feels like she is bleeding all the time, most days/month. Also reports bad headaches and pelvic pressure and vaginal discharge. no new sexual partner. + nausea, no emesis. No fever, + chills. Also has had headaches.  Review of Systems  Constitutional: Positive for chills. Negative for fever.  Respiratory: Negative for shortness of breath.   Cardiovascular: Negative for chest pain.  Gastrointestinal: Positive for nausea. Negative for abdominal pain and vomiting.  Genitourinary: Positive for vaginal discharge. Negative for dysuria.  Skin: Negative for rash.      Objective:    BP 115/70   Pulse 79   Ht 5\' 6"  (1.676 m)   Wt 175 lb (79.4 kg)   LMP 04/16/2018   Breastfeeding? No   BMI 28.25 kg/m  Physical Exam  Constitutional: She is oriented to person, place, and time. She appears well-developed and well-nourished. No distress.  HENT:  Head: Normocephalic and atraumatic.  Eyes: No scleral icterus.  Neck: Neck supple.  Cardiovascular: Normal rate.  Pulmonary/Chest: Effort normal.  Abdominal: Soft.  Genitourinary: Vaginal discharge (bloody) found.  Neurological: She is alert and oriented to person, place, and time.  Skin: Skin is warm and dry.  Psychiatric: She has a normal mood and affect.        Assessment & Plan:   Problem List Items Addressed This Visit    None    Visit Diagnoses    Pelvic pain    -  Primary   ? related to Nexplanon-check pelvic sono--presumptive treatment of chronic endometritis with doxy   Relevant Medications   doxycycline (VIBRAMYCIN) 100 MG capsule   Other Relevant Orders   US PELVIC COMPLETE WITH TRANSVAGINAL   Vaginal discharge       Check wet prep and treat accordingly   Relevant Orders   Cervicovaginal ancillary only   Encounter for counseling regarding contraception       discussed depo, OC's NuvaRing, IUD. May consider changing to IUD.Marland Kitchen.      Total face-to-face time with patient: 15 minutes. Over 50% of encounter was spent on counseling and coordination of care. Return in about 4 weeks (around 05/15/2018) for nexplanon removal, IUD insertion.  Reva Boresanya S Nitya Cauthon 04/17/2018 2:35 PM

## 2018-04-17 NOTE — Progress Notes (Signed)
Pt c/o bleeding, headaches, pelvic pain and discharge after getting Nexplanon in March 2019

## 2018-04-18 LAB — CERVICOVAGINAL ANCILLARY ONLY
Bacterial vaginitis: POSITIVE — AB
Candida vaginitis: NEGATIVE
Chlamydia: NEGATIVE
Neisseria Gonorrhea: NEGATIVE
Trichomonas: NEGATIVE

## 2018-04-21 MED ORDER — METRONIDAZOLE 500 MG PO TABS: 500.0000 mg | ORAL_TABLET | Freq: Two times a day (BID) | ORAL | 0 refills | Status: DC

## 2018-04-21 NOTE — Addendum Note (Signed)
Addended by: Reva BoresPRATT, Maryl Blalock S on: 04/21/2018 11:11 AM   Modules accepted: Orders

## 2018-04-23 ENCOUNTER — Ambulatory Visit (INDEPENDENT_AMBULATORY_CARE_PROVIDER_SITE_OTHER): Payer: Self-pay

## 2018-04-23 DIAGNOSIS — R102 Pelvic and perineal pain: Secondary | ICD-10-CM

## 2018-04-23 DIAGNOSIS — N926 Irregular menstruation, unspecified: Secondary | ICD-10-CM

## 2018-04-30 ENCOUNTER — Telehealth: Payer: Self-pay

## 2018-04-30 NOTE — Telephone Encounter (Addendum)
Returning pt's call about U/S results. I left a voicemail on pt's phone letting her know that U/S results were normal.

## 2018-06-02 ENCOUNTER — Encounter: Payer: Self-pay | Admitting: Obstetrics & Gynecology

## 2018-06-02 ENCOUNTER — Ambulatory Visit (INDEPENDENT_AMBULATORY_CARE_PROVIDER_SITE_OTHER): Payer: BLUE CROSS/BLUE SHIELD | Admitting: Obstetrics & Gynecology

## 2018-06-02 VITALS — Ht 63.0 in | Wt 177.0 lb

## 2018-06-02 DIAGNOSIS — Z3009 Encounter for other general counseling and advice on contraception: Secondary | ICD-10-CM

## 2018-06-02 DIAGNOSIS — Z3202 Encounter for pregnancy test, result negative: Secondary | ICD-10-CM | POA: Diagnosis not present

## 2018-06-02 DIAGNOSIS — Z3046 Encounter for surveillance of implantable subdermal contraceptive: Secondary | ICD-10-CM | POA: Diagnosis not present

## 2018-06-02 DIAGNOSIS — Z30430 Encounter for insertion of intrauterine contraceptive device: Secondary | ICD-10-CM

## 2018-06-02 LAB — POCT URINE PREGNANCY: Preg Test, Ur: NEGATIVE

## 2018-06-02 IMAGING — US US MFM OB DETAIL+14 WK
1 series · 14 of 28 positions shown · non-contrast
Comparison: none

[Series 1: us mfm ob detail+14 wk · 79 acquisitions, 14 frames shown]
[im 3/79]
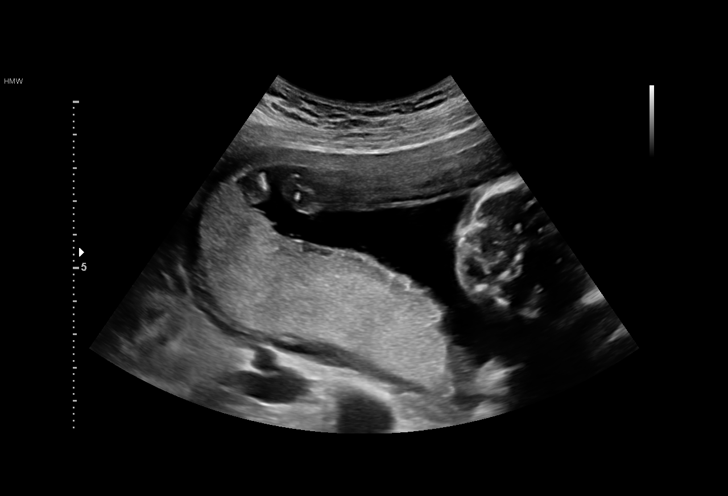
[im 9/79]
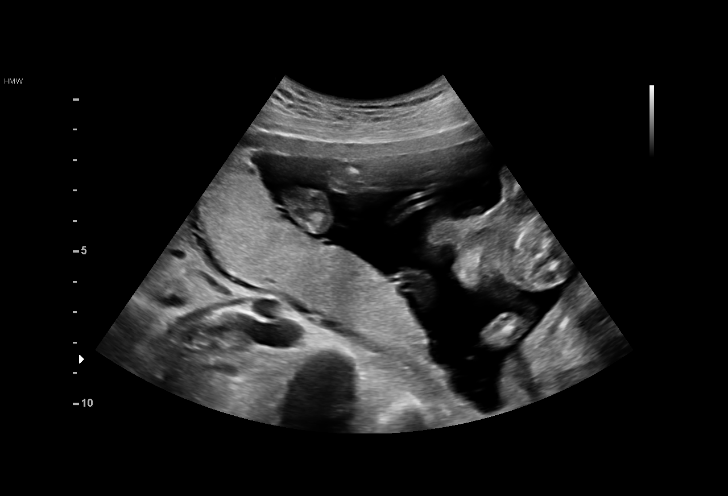
[im 15/79]
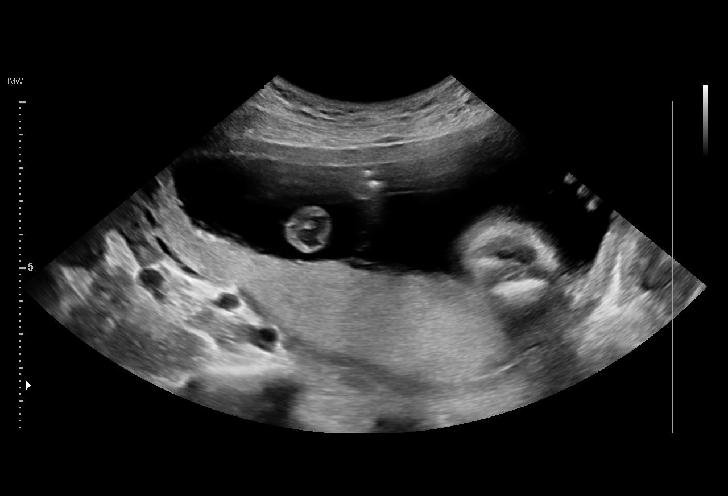
[im 21/79]
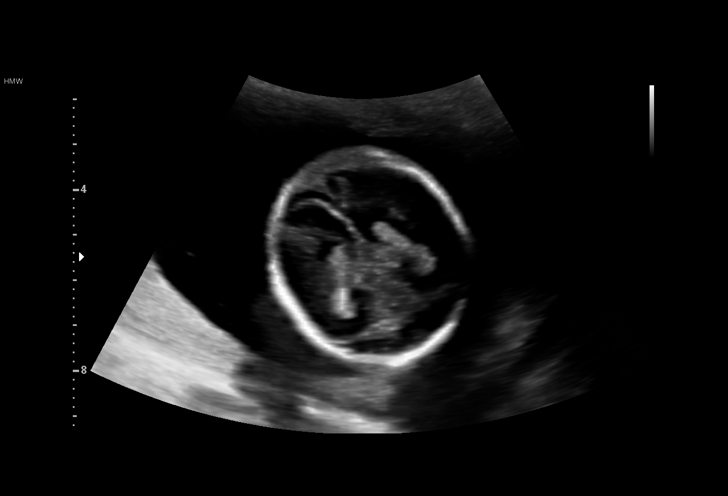
[im 27/79]
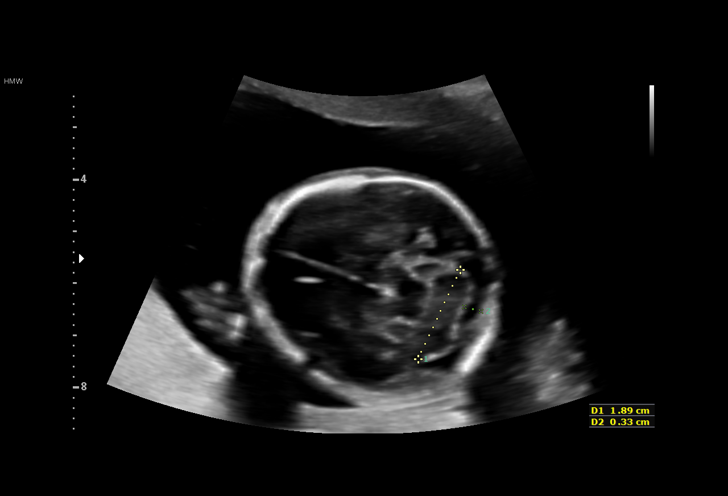
[im 32/79]
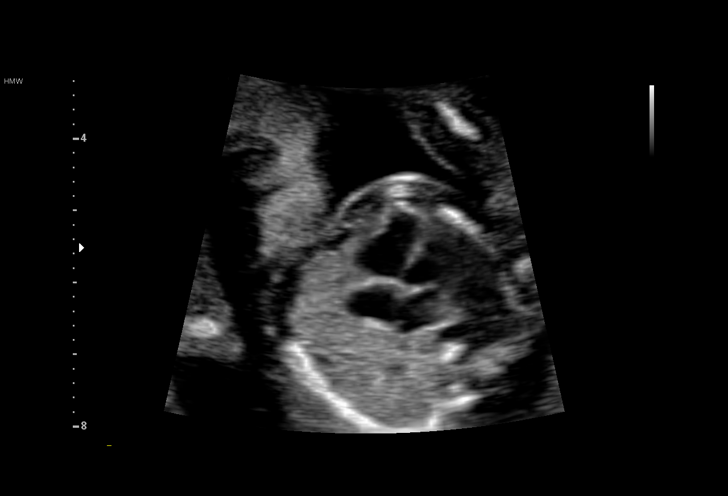
[im 38/79]
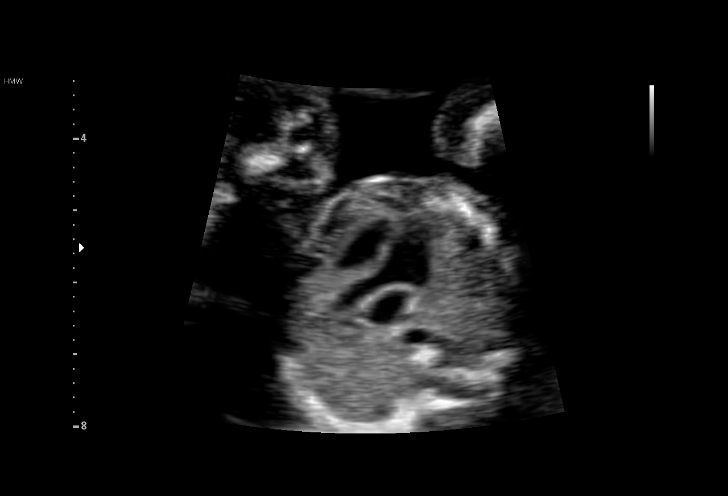
[im 44/79]
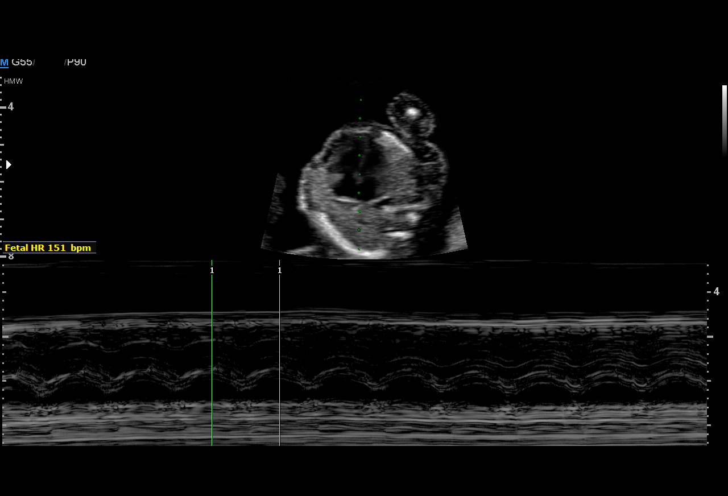
[im 50/79]
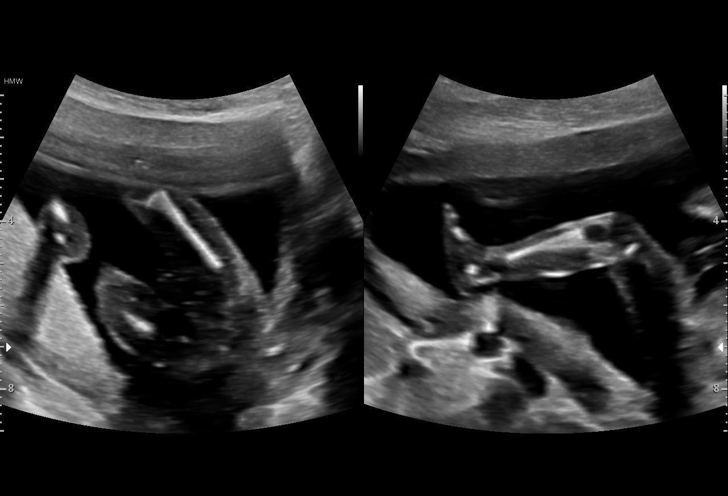
[im 55/79]
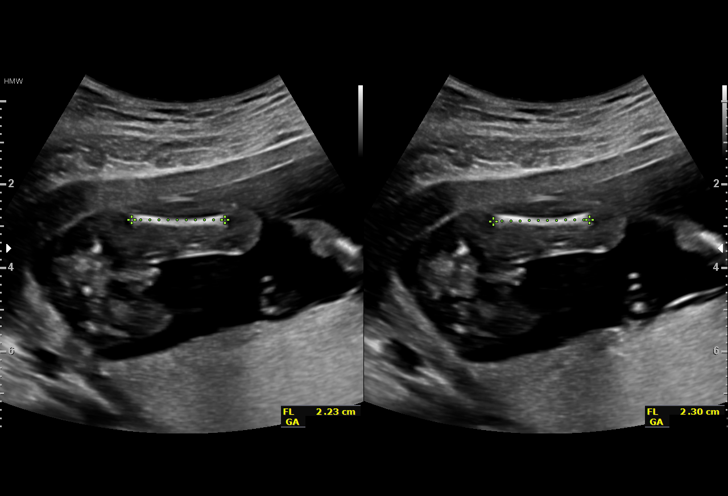
[im 61/79]
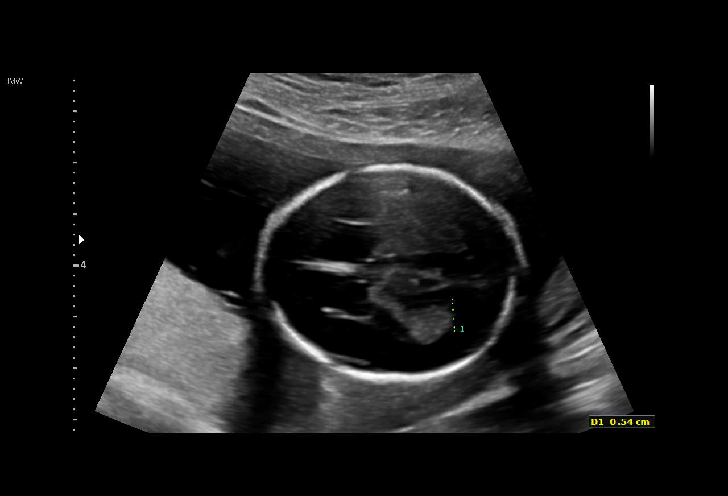
[im 67/79]
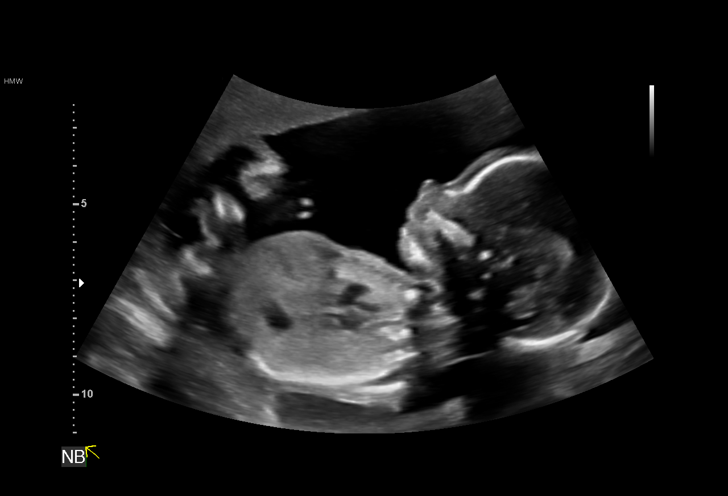
[im 73/79]
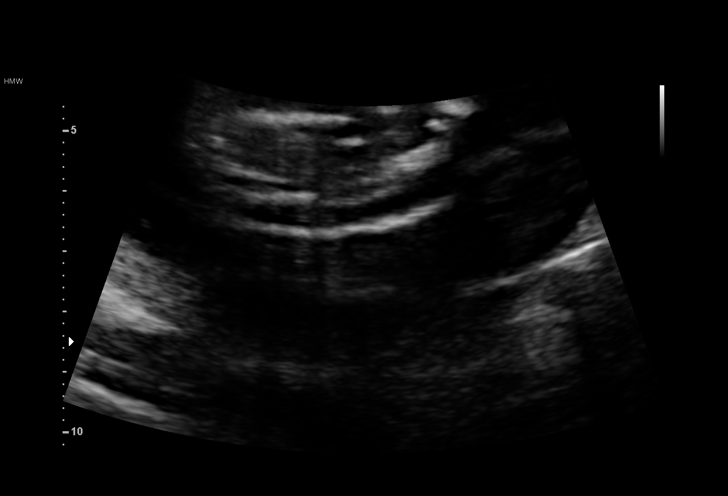
[im 79/79]
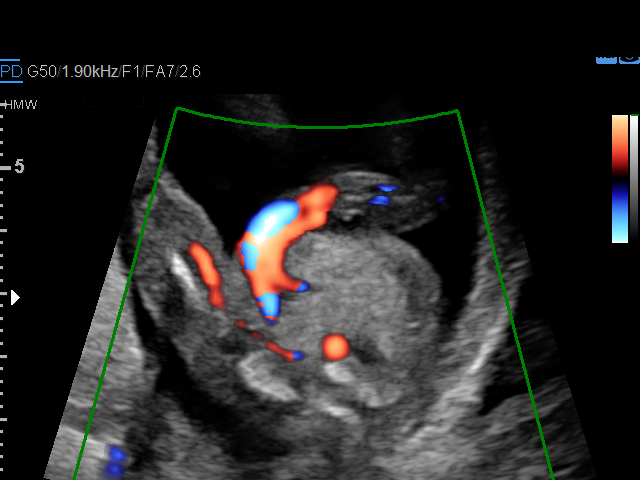

[14 of 28 positions shown; findings below may reference images not displayed]

1  XENIOTIS RAGIA                [id number]      [id number]     [id number]
Indications

19 weeks gestation of pregnancy
Encounter for antenatal screening for
malformations
Abnormal biochemical screen (quad) for
Trisomy 21 ([DATE])
OB History

Gravidity:    2         Term:   0        Prem:   0        SAB:   1
TOP:          0       Ectopic:  0        Living: 0
Fetal Evaluation

Num Of Fetuses:     1
Fetal Heart         151
Rate(bpm):
Cardiac Activity:   Observed
Presentation:       Transverse, head to maternal left
Placenta:           Posterior, above cervical os
P. Cord Insertion:  Visualized, central

Amniotic Fluid
AFI FV:      Subjectively within normal limits

Largest Pocket(cm)
4.2
Biometry

BPD:      41.5  mm     G. Age:  18w 4d         32  %    CI:        77.43   %   70 - 86
FL/HC:      15.1   %   16.1 -
HC:      149.3  mm     G. Age:  18w 0d          8  %    HC/AC:      1.16       1.09 -
AC:      128.2  mm     G. Age:  18w 3d         26  %    FL/BPD:     54.5   %
FL:       22.6  mm     G. Age:  16w 5d        < 3  %    FL/AC:      17.6   %   20 - 24
HUM:      24.6  mm     G. Age:  17w 5d         12  %
CER:      18.9  mm     G. Age:  18w 3d         34  %

Est. FW:     205  gm      0 lb 7 oz   < 25  %
Gestational Age

U/S Today:     18w 0d                                        EDD:   07/29/17
Best:          19w 0d    Det. By:   Early Ultrasound         EDD:   07/22/17
(12/23/16)
Anatomy

Cranium:               Appears normal         Aortic Arch:            Not well visualized
Cavum:                 Appears normal         Ductal Arch:            Appears normal
Ventricles:            Appears normal         Diaphragm:              Appears normal
Choroid Plexus:        Appears normal         Stomach:                Appears normal, left
sided
Cerebellum:            Appears normal         Abdomen:                Appears normal
Posterior Fossa:       Appears normal         Abdominal Wall:         Appears nml (cord
insert, abd wall)
Nuchal Fold:           Appears normal         Cord Vessels:           Appears normal (3
vessel cord)
Face:                  Appears normal         Kidneys:                Appear normal
(orbits and profile)
Lips:                  Appears normal         Bladder:                Appears normal
Thoracic:              Appears normal         Spine:                  Appears normal
Heart:                 Appears normal         Upper Extremities:      Appears normal
(4CH, axis, and situs
RVOT:                  Appears normal         Lower Extremities:      Appears normal
LVOT:                  Appears normal

Other:  Heels and 5th digit visualized. Nasal bone visualized. Technically
difficult due to fetal position.
Cervix Uterus Adnexa

Cervix
Length:              4  cm.
Normal appearance by transabdominal scan.

Uterus
No abnormality visualized.

Left Ovary
No adnexal mass visualized.

Right Ovary
No adnexal mass visualized.

Cul De Sac:   No free fluid seen.

Adnexa:       No abnormality visualized.
Impression

SIUP at 19+0 weeks
Normal detailed fetal anatomy; limited views of AA
Markers of aneuploidy: ? short long bones
Normal amniotic fluid volume
Measurements consistent with early US but lagging FL

After genetic counseling, Ms. Amazigh decided to have cell
free DNA screening.
Recommendations

Follow-up ultrasound for growth in 4 weeks

## 2018-06-02 MED ORDER — LEVONORGESTREL 19.5 MCG/DAY IU IUD
INTRAUTERINE_SYSTEM | Freq: Once | INTRAUTERINE | Status: AC
  Administered 2018-06-02: 16:00:00 via INTRAUTERINE

## 2018-06-02 NOTE — Progress Notes (Signed)
   Subjective:    Patient ID: Shari Skinner, female    DOB: 12/31/1995, 23 y.o.   MRN: [id number]  HPI  23 yo female 81 months pp and unhappy with bleeding from Nexplanon.  Pt would like prog IUD.  Pt has been feeling tired and "not right" with Nexplanon.  Bleeding has been better after the doxycycline but she still desires for the Nexplanon to be removed.  Review of Systems  Constitutional: Negative.   Respiratory: Negative.   Cardiovascular: Negative.   Gastrointestinal: Negative.   Genitourinary: Positive for menstrual problem and vaginal bleeding.       Objective:   Physical Exam Vitals signs reviewed.  Constitutional:      General: She is not in acute distress.    Appearance: She is well-developed.  HENT:     Head: Normocephalic and atraumatic.  Eyes:     Conjunctiva/sclera: Conjunctivae normal.  Cardiovascular:     Rate and Rhythm: Normal rate.  Pulmonary:     Effort: Pulmonary effort is normal.  Abdominal:     General: Abdomen is flat.     Palpations: Abdomen is soft.  Genitourinary:    Comments: + blood in vault; anteverted uterus. Skin:    General: Skin is warm and dry.  Neurological:     Mental Status: She is alert and oriented to person, place, and time.     Assessment & Plan:  23 yo female not happy with side effects of Nexplanon.  Counseling on both types of contraceton.    IUD Procedure Note Patient identified, informed consent performed.  Discussed risks of irregular bleeding, cramping, infection, malpositioning or misplacement of the IUD outside the uterus which may require further procedures. Time out was performed.  Urine pregnancy test negative.  Speculum placed in the vagina.  Cervix visualized.  Cleaned with Betadine x 2.  Grasped anteriorly with a single tooth tenaculum.  Uterus sounded to almost 8 cm.  Liletta IUD placed per manufacturer's recommendations.  Strings trimmed to 2 cm. Tenaculum was removed, good hemostasis noted.  Patient  tolerated procedure well.   Patient was given post-procedure instructions and the Liletta care card with expiration date.  Patient was also asked to check IUD strings periodically and follow up in 4-6 weeks for IUD check.  Nexplanon Removal Patient was given informed consent for removal of her Nexplanon.  Appropriate time out taken. Nexplanon site identified.  Area prepped in usual sterile fashon. One ml of 1% lidocaine was used to anesthetize the area at the distal end of the implant. A small stab incision was made right beside the implant on the distal portion.  The Nexplanon rod was grasped using hemostats and removed without difficulty.  There was minimal blood loss. There were no complications.  A small amount of antibiotic ointment and steri-strips were applied over the small incision.  A pressure bandage was applied to reduce any bruising.  The patient tolerated the procedure well and was given post procedure instructions.

## 2018-06-02 NOTE — Progress Notes (Signed)
neg

## 2018-06-30 ENCOUNTER — Encounter: Payer: Self-pay | Admitting: Obstetrics & Gynecology

## 2018-06-30 ENCOUNTER — Ambulatory Visit: Payer: BLUE CROSS/BLUE SHIELD | Admitting: Obstetrics & Gynecology

## 2018-06-30 VITALS — BP 110/65 | HR 92 | Resp 16 | Ht 63.0 in | Wt 178.0 lb

## 2018-06-30 DIAGNOSIS — Z975 Presence of (intrauterine) contraceptive device: Secondary | ICD-10-CM

## 2018-06-30 DIAGNOSIS — Z30431 Encounter for routine checking of intrauterine contraceptive device: Secondary | ICD-10-CM

## 2018-06-30 NOTE — Progress Notes (Signed)
   Subjective:    Patient ID: Shari Skinner, female    DOB: Dec 14, 1995, 23 y.o.   MRN: [id number]  HPI  Pt presents for IUD string check from Jan 6th.  Pt not having any complaints.  No bleeding.  No issues form Nexplanon removal.  No issues during intercourse.  Review of Systems  Constitutional: Negative.   Respiratory: Negative.   Cardiovascular: Negative.   Gastrointestinal: Negative.   Genitourinary: Negative.        Objective:   Physical Exam Abdominal:     General: Abdomen is flat.     Palpations: Abdomen is soft.  Genitourinary:    General: Normal vulva.     Vagina: No vaginal discharge.     Comments: No blood in vault Strings short and seen at os   Assessment & Plan:  23 yo female for IUD surveillance.  1.  IUD strings seen and no clinical issues. 2.  Pap up to date. 3.  RTC 1 year of sooner if needed.

## 2018-08-29 IMAGING — US US MFM OB FOLLOW-UP
1 series · 14 of 28 positions shown · non-contrast
Comparison: none

[Series 1: us mfm ob follow-up · 38 acquisitions, 14 frames shown]
[im 2/38]
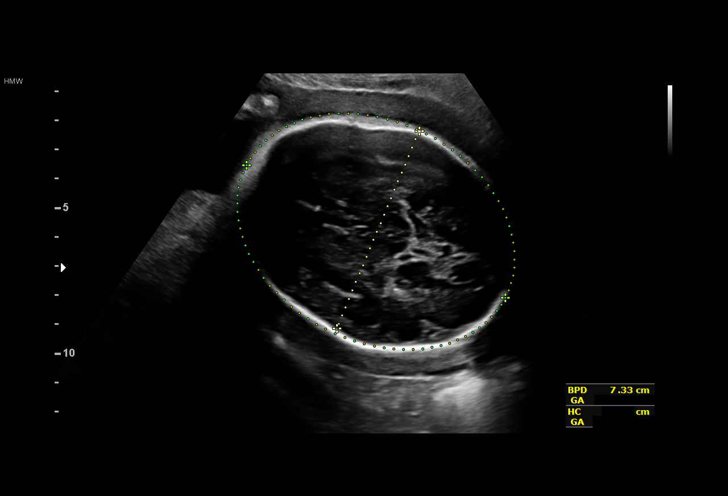
[im 5/38]
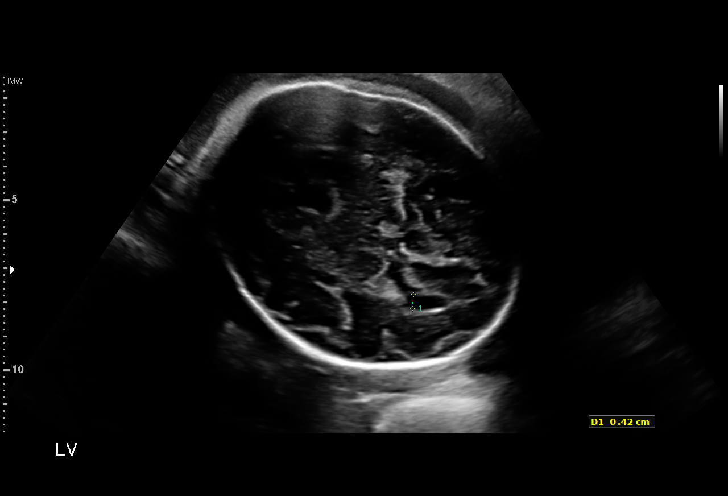
[im 7/38]
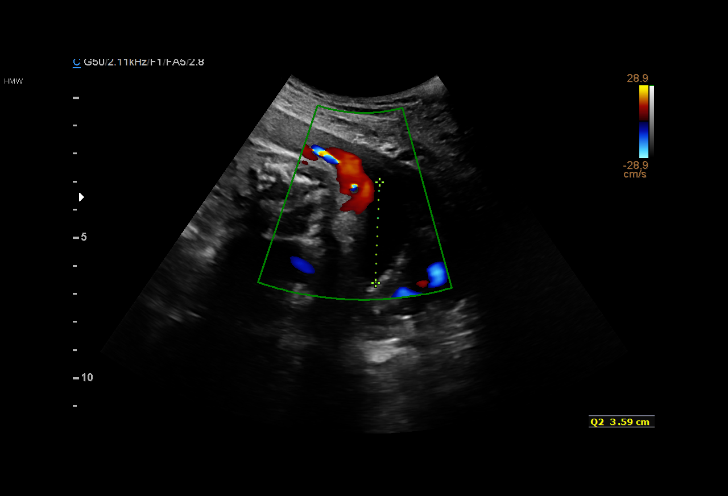
[im 10/38]
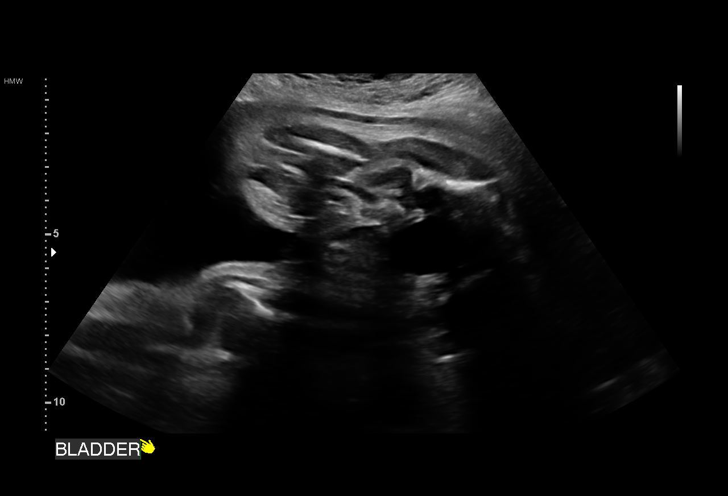
[im 13/38]
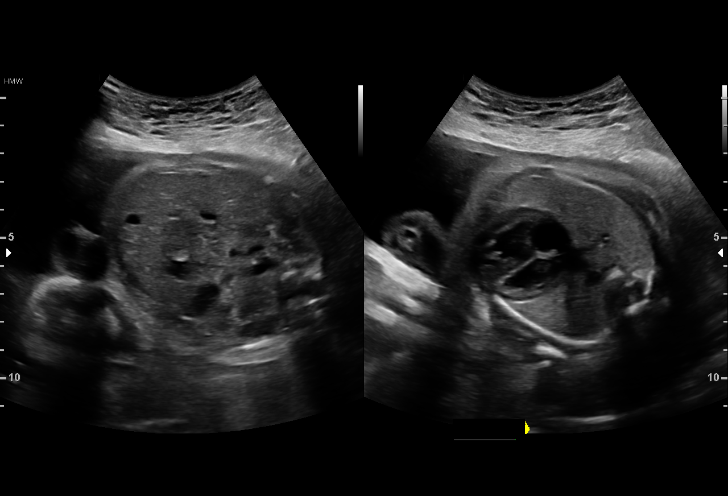
[im 16/38]
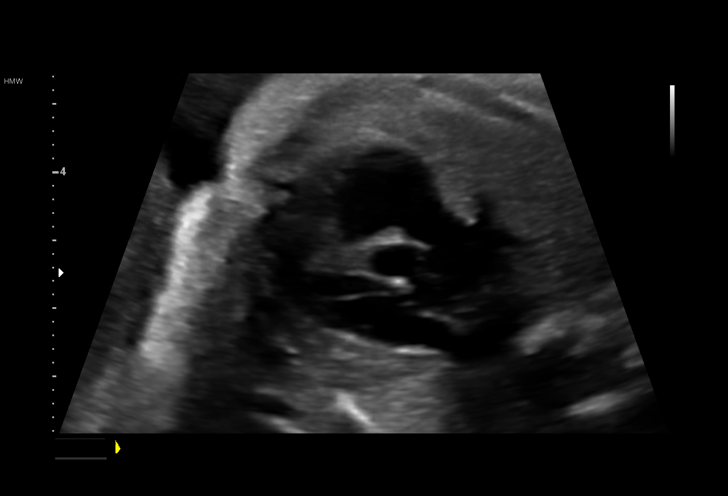
[im 18/38]
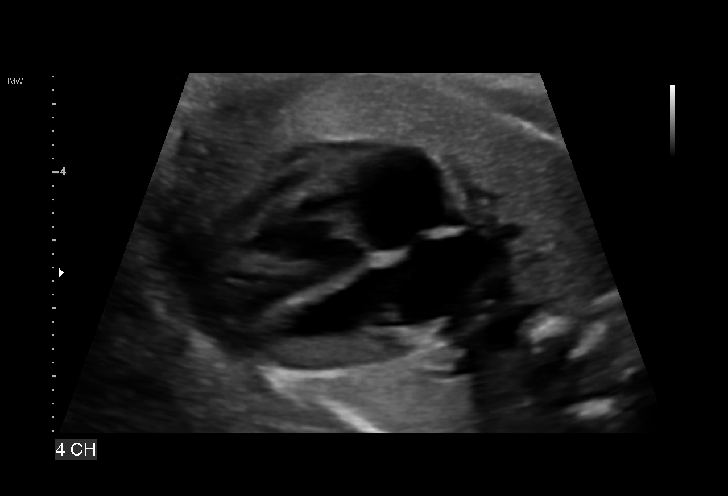
[im 21/38]
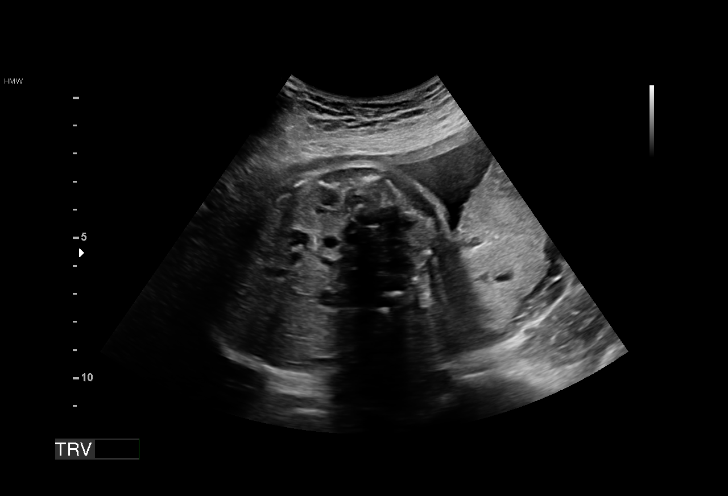
[im 24/38]
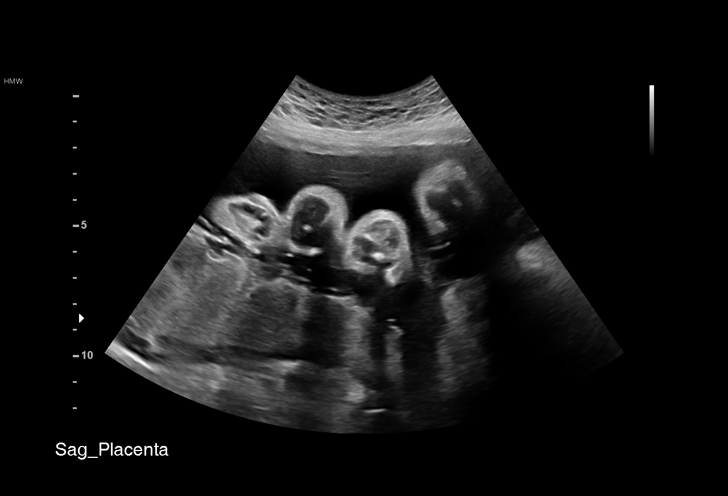
[im 27/38]
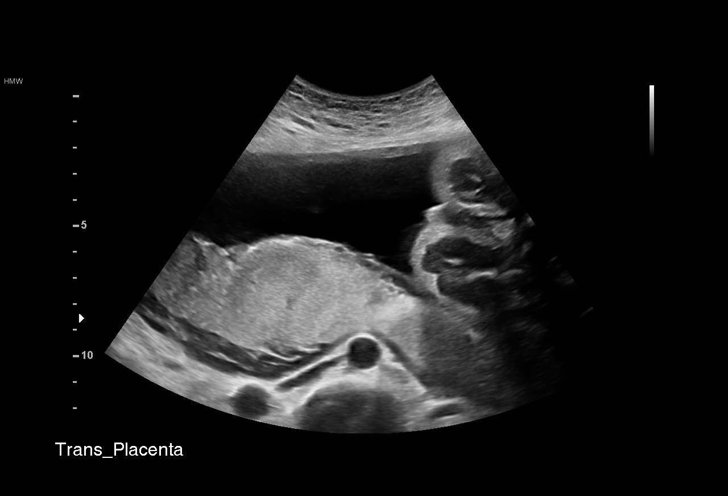
[im 29/38]
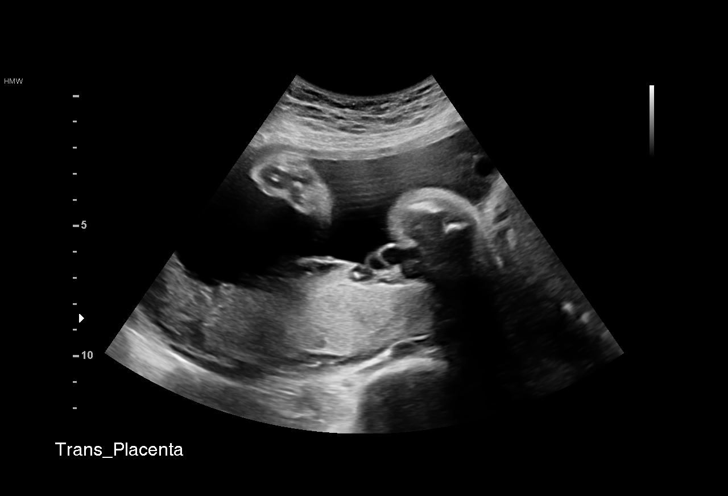
[im 32/38]
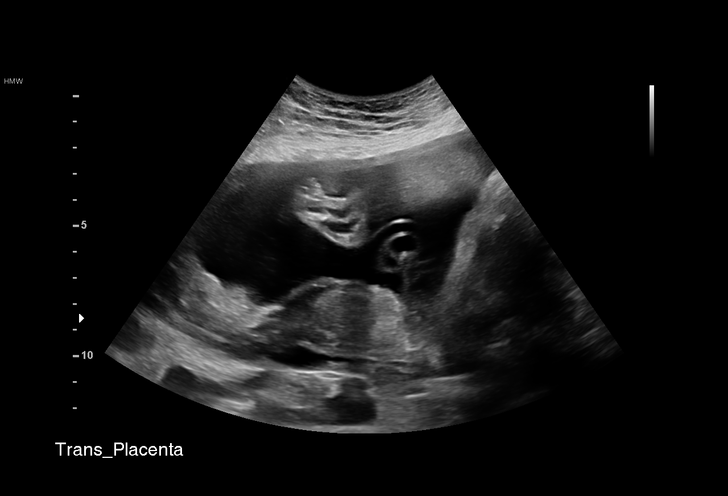
[im 35/38]
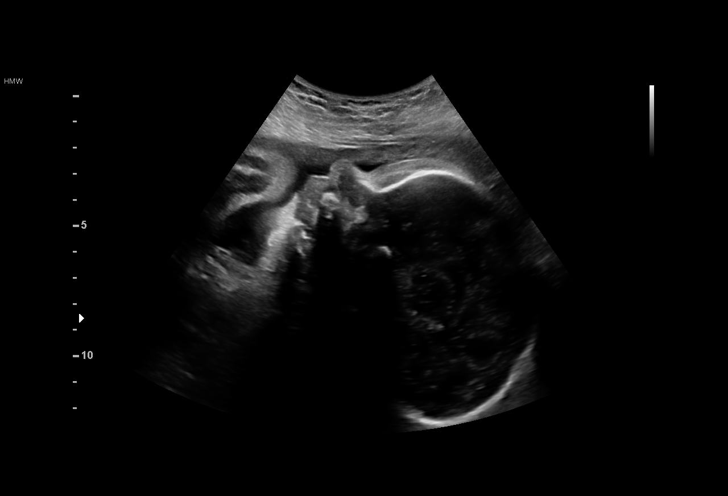
[im 38/38]
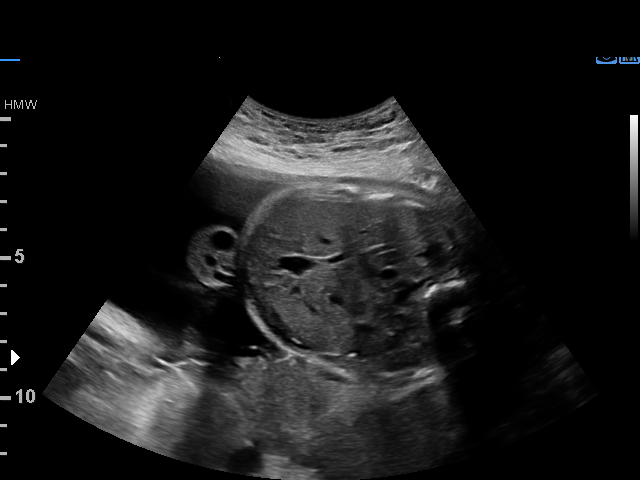

[14 of 28 positions shown; findings below may reference images not displayed]

1  ROSINE BREUER           [id number]      [id number]     [id number]
Indications

31 weeks gestation of pregnancy
Abnormal biochemical screen (quad) for
Trisomy 21 ([DATE]) (NML NIPS)
Encounter for other antenatal screening
follow-up
OB History

Gravidity:    2         Term:   0        Prem:   0        SAB:   1
TOP:          0       Ectopic:  0        Living: 0
Fetal Evaluation

Num Of Fetuses:     1
Fetal Heart         135
Rate(bpm):
Cardiac Activity:   Observed
Presentation:       Cephalic
Placenta:           Posterior, above cervical os
P. Cord Insertion:  Visualized, central

Amniotic Fluid
AFI FV:      Subjectively within normal limits

AFI Sum(cm)     %Tile       Largest Pocket(cm)
14.02           47

RUQ(cm)       RLQ(cm)       LUQ(cm)        LLQ(cm)
2.92
Biometry
BPD:      74.4  mm     G. Age:  29w 6d          5  %    CI:        72.65   %    70 - 86
FL/HC:      18.4   %    19.1 -
HC:      277.6  mm     G. Age:  30w 3d        < 3  %    HC/AC:      1.06        0.96 -
AC:      260.7  mm     G. Age:  30w 1d         14  %    FL/BPD:     68.5   %    71 - 87
FL:         51  mm     G. Age:  27w 2d        < 3  %    FL/AC:      19.6   %    20 - 24
HUM:      47.8  mm     G. Age:  28w 0d        < 5  %

Est. FW:    9112  gm           3 lb     17  %
Gestational Age

U/S Today:     29w 3d                                        EDD:   08/06/17
Best:          31w 4d     Det. By:  Early Ultrasound         EDD:   07/22/17
(12/23/16)
Anatomy

Cranium:               Appears normal         Aortic Arch:            Previously seen
Cavum:                 Previously seen        Ductal Arch:            Previously seen
Ventricles:            Appears normal         Diaphragm:              Previously seen
Choroid Plexus:        Previously seen        Stomach:                Appears normal, left
sided
Cerebellum:            Previously seen        Abdomen:                Previously seen
Posterior Fossa:       Previously seen        Abdominal Wall:         Previously seen
Nuchal Fold:           Previously seen        Cord Vessels:           Previously seen
Face:                  Orbits and profile     Kidneys:                Appear normal
previously seen
Lips:                  Previously seen        Bladder:                Appears normal
Thoracic:              Appears normal         Spine:                  Previously seen
Heart:                 Appears normal         Upper Extremities:      Previously seen
(4CH, axis, and situs
RVOT:                  Appears normal         Lower Extremities:      Previously seen
LVOT:                  Appears normal

Other:  Heels and 5th digit previously visualized.  Technically difficult due to
fetal position.
Cervix Uterus Adnexa

Cervix
Not visualized (advanced GA >98wks)

Uterus
No abnormality visualized.

Left Ovary
No adnexal mass visualized.

Right Ovary
No adnexal mass visualized.

Cul De Sac:   No free fluid seen.

Adnexa:       No abnormality visualized.
Impression

SIUP at 14w8d
active fetus
EFW 17th%'le
no defects seen
normal AFI
no previa
Recommendations

Findings of today's ultrasound reviewed. Given normal
morphology of the long bones and normal interval growth,
short long bones likely constitutional.
Recommend follow-up ultrasound examination in 4 weeks for
reassessment of fetal growth.

## 2018-09-26 IMAGING — US US MFM UA CORD DOPPLER
1 series · 14 of 28 positions shown · non-contrast
Comparison: none

[Series 1: us mfm ua cord doppler · 50 acquisitions, 14 frames shown]
[im 2/50]
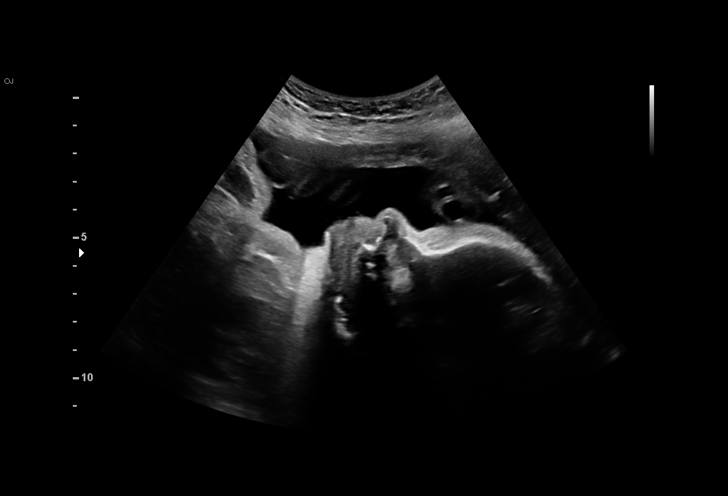
[im 6/50]
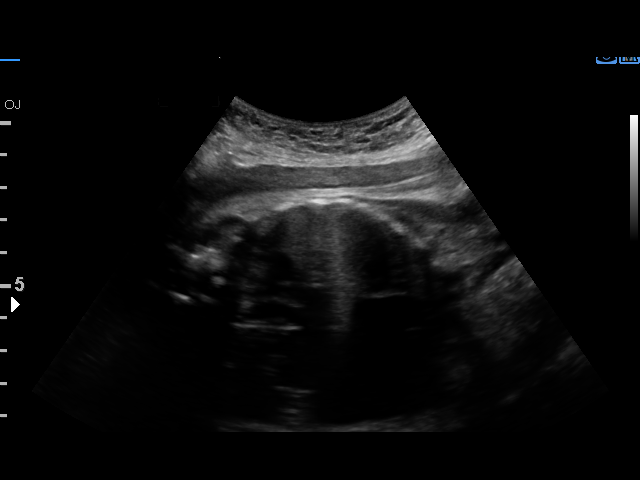
[im 10/50]
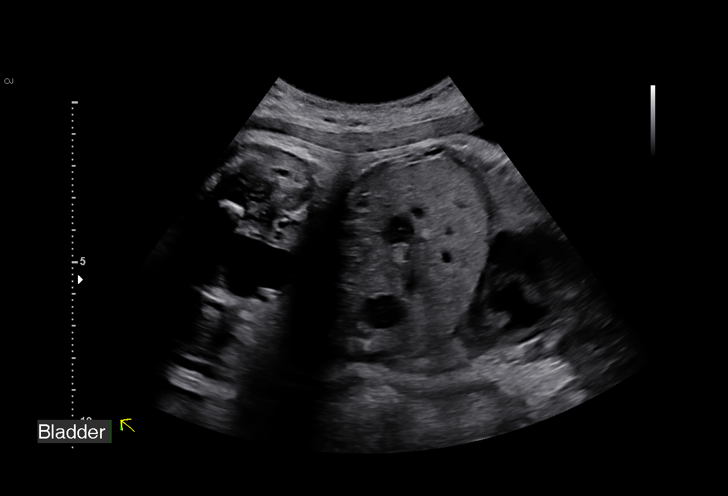
[im 13/50]
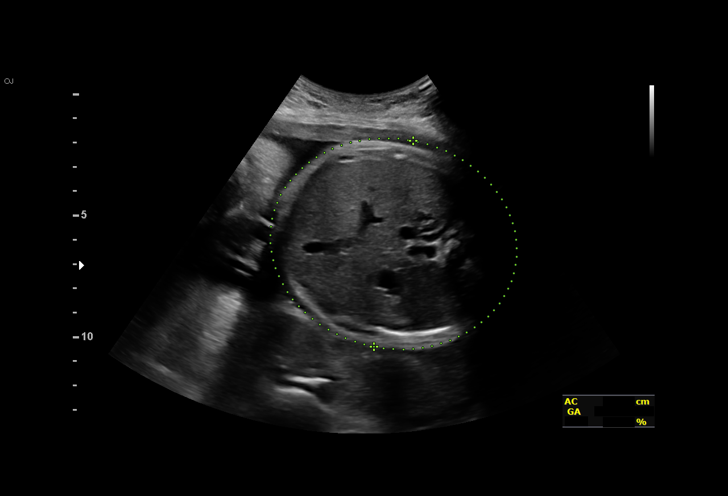
[im 17/50]
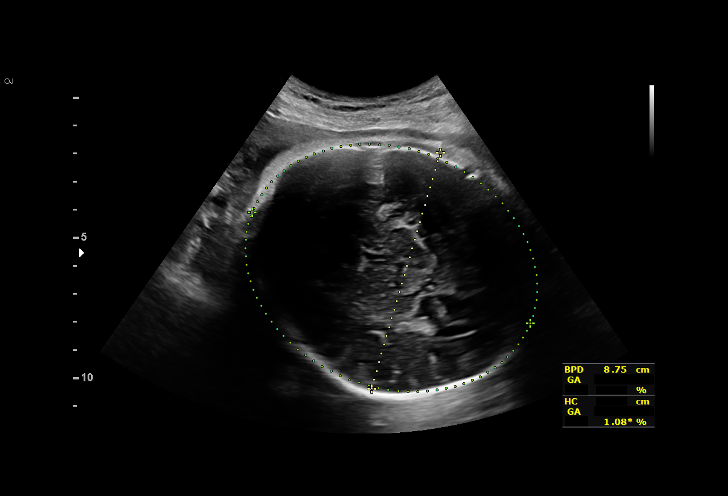
[im 20/50]
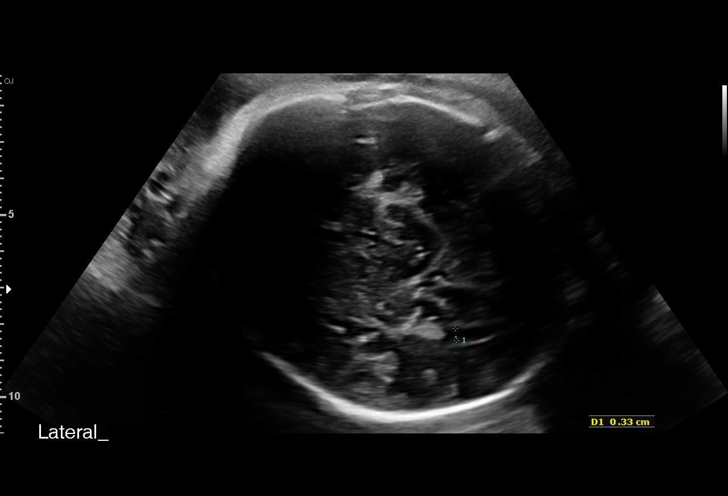
[im 24/50]
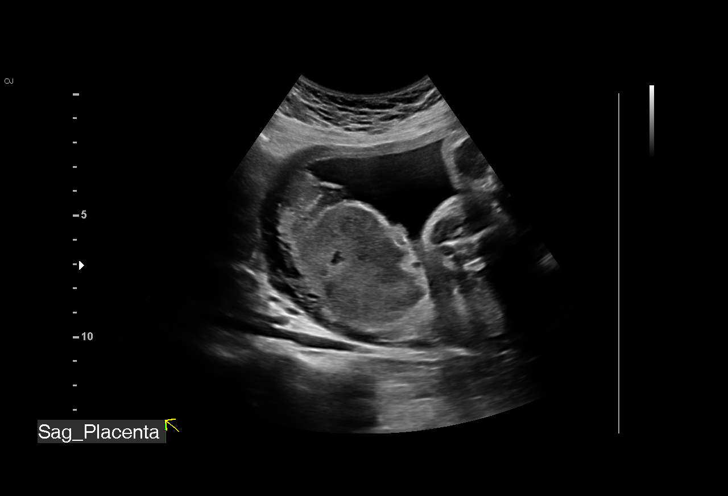
[im 28/50]
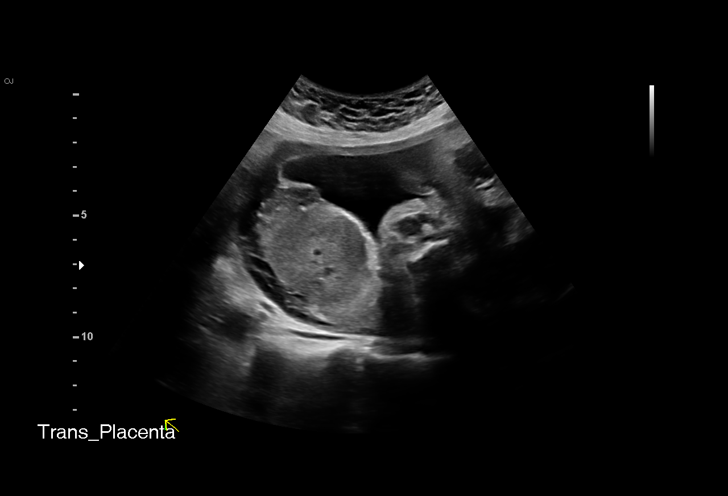
[im 31/50]
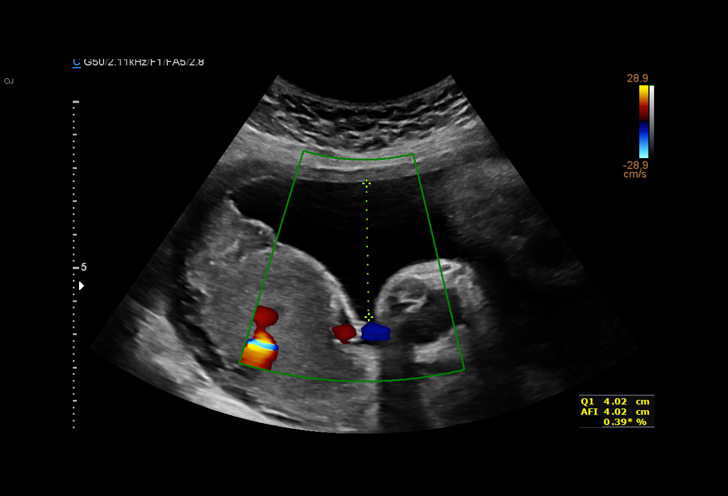
[im 35/50]
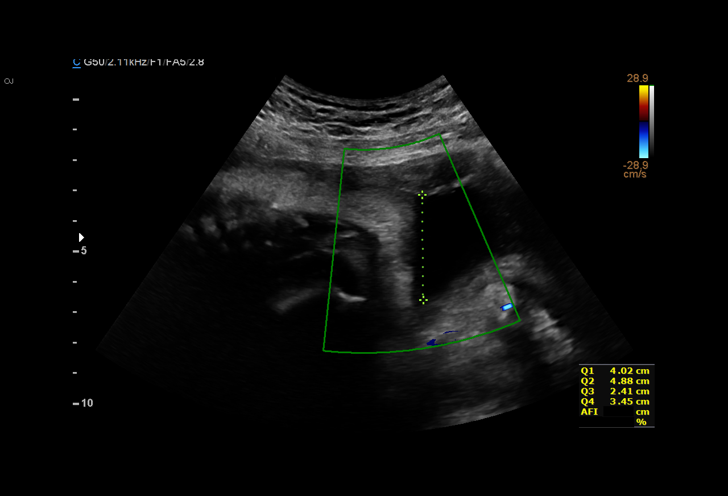
[im 39/50]
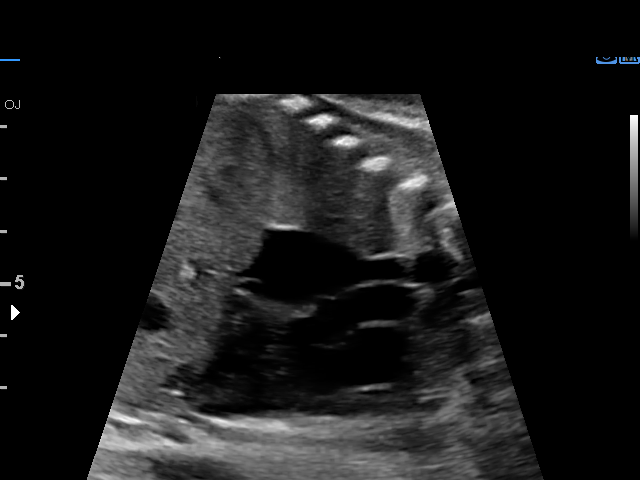
[im 42/50]
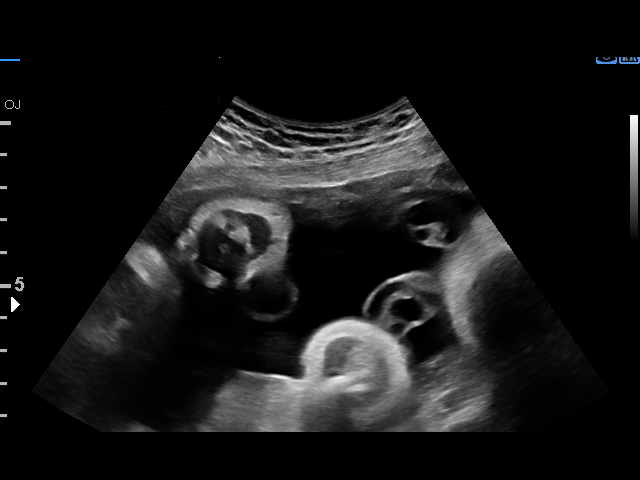
[im 46/50]
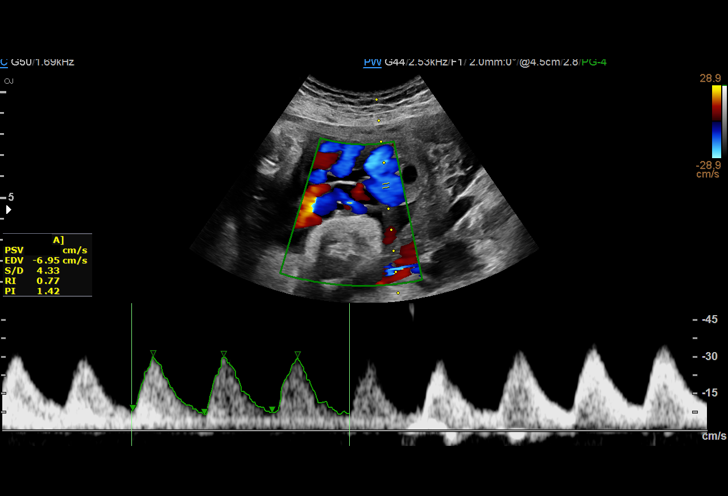
[im 50/50]
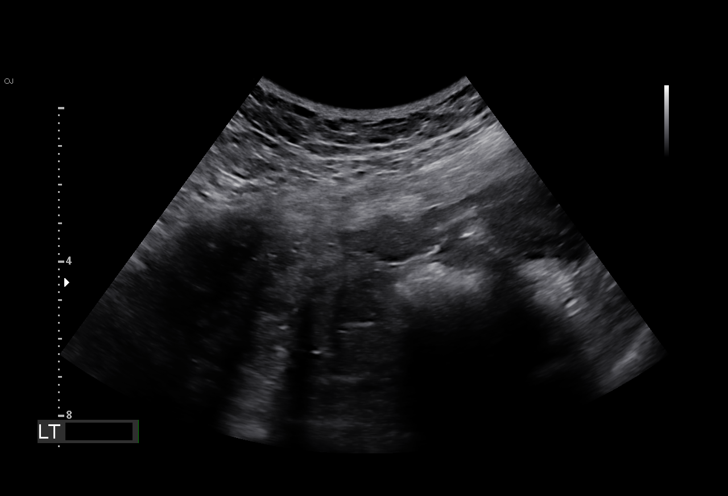

[14 of 28 positions shown; findings below may reference images not displayed]

1  SAKINA CHAPA          [id number]      [id number]     [id number]
2  SAKINA CHAPA          [id number]      [id number]     [id number]
3  SAKINA CHAPA          [id number]      [id number]     [id number]
Indications

35 weeks gestation of pregnancy
Abnormal biochemical screen (quad) for
Trisomy 21 ([DATE]) (NML NIPS)
Small for gestational age fetus affecting
management of mother
OB History

Gravidity:    2         Term:   0        Prem:   0        SAB:   1
TOP:          0       Ectopic:  0        Living: 0
Fetal Evaluation

Num Of Fetuses:     1
Fetal Heart         148
Rate(bpm):
Cardiac Activity:   Observed
Presentation:       Cephalic
Placenta:           Posterior, above cervical os
P. Cord Insertion:  Previously Visualized

Amniotic Fluid
AFI FV:      Subjectively within normal limits

AFI Sum(cm)     %Tile       Largest Pocket(cm)
14.76           54

RUQ(cm)       RLQ(cm)       LUQ(cm)        LLQ(cm)
4.02
Biophysical Evaluation

Amniotic F.V:   Within normal limits       F. Tone:        Observed
F. Movement:    Observed                   Score:          [DATE]
F. Breathing:   Observed
Biometry

BPD:        87  mm     G. Age:  35w 1d         42  %    CI:         81.4   %    70 - 86
FL/HC:      18.9   %    20.1 -
HC:      304.4  mm     G. Age:  33w 6d        < 3  %    HC/AC:      1.04        0.93 -
AC:      292.4  mm     G. Age:  33w 2d          7  %    FL/BPD:     66.2   %    71 - 87
FL:       57.6  mm     G. Age:  30w 1d        < 3  %    FL/AC:      19.7   %    20 - 24
HUM:      51.9  mm     G. Age:  30w 2d        < 5  %

Est. FW:    4664  gm      4 lb 7 oz   < 10  %
Gestational Age

U/S Today:     33w 1d                                        EDD:   08/08/17
Best:          35w 4d     Det. By:  Early Ultrasound         EDD:   07/22/17
(12/23/16)
Anatomy

Cranium:               Appears normal         Aortic Arch:            Previously seen
Cavum:                 Previously seen        Ductal Arch:            Previously seen
Ventricles:            Appears normal         Diaphragm:              Previously seen
Choroid Plexus:        Previously seen        Stomach:                Appears normal, left
sided
Cerebellum:            Previously seen        Abdomen:                Appears normal
Posterior Fossa:       Previously seen        Abdominal Wall:         Previously seen
Nuchal Fold:           Previously seen        Cord Vessels:           Appears normal (3
vessel cord)
Face:                  Orbits and profile     Kidneys:                Appear normal
previously seen
Lips:                  Previously seen        Bladder:                Appears normal
Thoracic:              Appears normal         Spine:                  Previously seen
Heart:                 Previously seen        Upper Extremities:      Previously seen
RVOT:                  Appears normal         Lower Extremities:      Previously seen
LVOT:                  Appears normal

Other:  Heels and 5th digit previously visualized.  Technically difficult due to
fetal position.
Doppler - Fetal Vessels

Umbilical Artery
S/D     %tile     RI              PI              PSV    ADFV    RDFV
(cm/s)
5.33    > 97.5  0.81             1.43             31.18      No      No

Cervix Uterus Adnexa

Cervix
Not visualized (advanced GA >20wks)
Uterus
No abnormality visualized.

Left Ovary
Not visualized.

Right Ovary
Size(cm)       2.6  x   2.3    x  1.5       Vol(ml):
Within normal limits.

Cul De Sac:   No free fluid seen.

Adnexa:       No abnormality visualized.
Impression

SIUP at 35+4 weeks
Cephalic presentation
Normal interval anatomy; anatomic survey complete
Normal amniotic fluid volume
EFW < 10th %tile; AC at the 7th %tile
BPP [DATE]
UA dopplers were elevated for this GA
Recommendations

Weekly BPPs and UA dopplers
If UA dopplers remain elevated, then a 37 week delivery
would be recommended

## 2018-09-30 ENCOUNTER — Telehealth: Payer: Self-pay

## 2018-09-30 DIAGNOSIS — B379 Candidiasis, unspecified: Secondary | ICD-10-CM

## 2018-09-30 MED ORDER — FLUCONAZOLE 150 MG PO TABS: 150.0000 mg | ORAL_TABLET | Freq: Once | ORAL | 0 refills | Status: AC

## 2018-09-30 NOTE — Telephone Encounter (Signed)
Pt called complaining of vaginal discharge and itching. Pt states she is sure she has a yeast infection. PT is aware we will send in Diflucan to limit appts due to COVID-19 but, if it does not get better then she will need to come to the office for appt.

## 2018-10-03 IMAGING — US US MFM UA CORD DOPPLER
1 series · 14 of 28 positions shown · non-contrast
Comparison: none

[Series 1: us mfm ua cord doppler · 28 acquisitions, 14 frames shown]
[im 2/28]
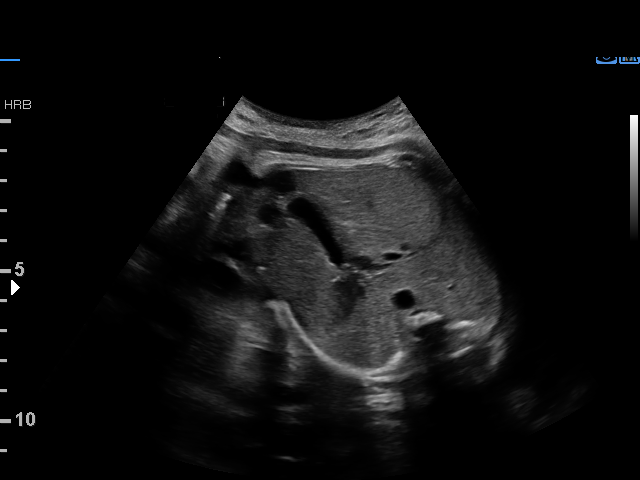
[im 4/28]
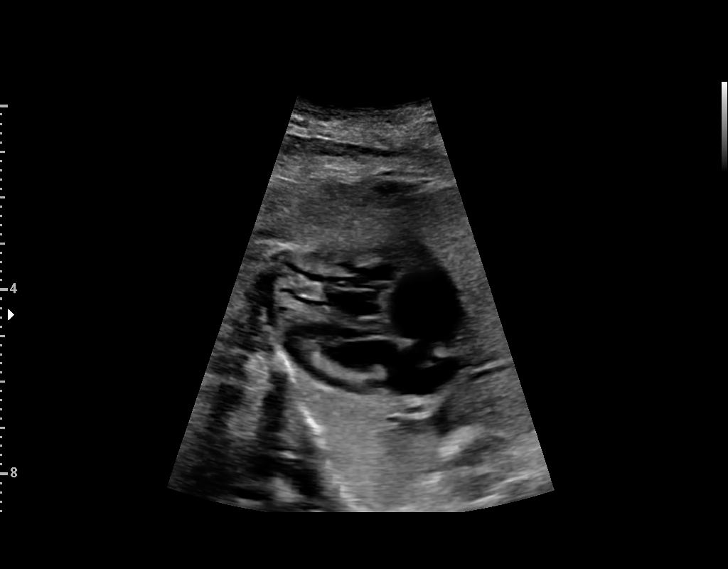
[im 6/28]
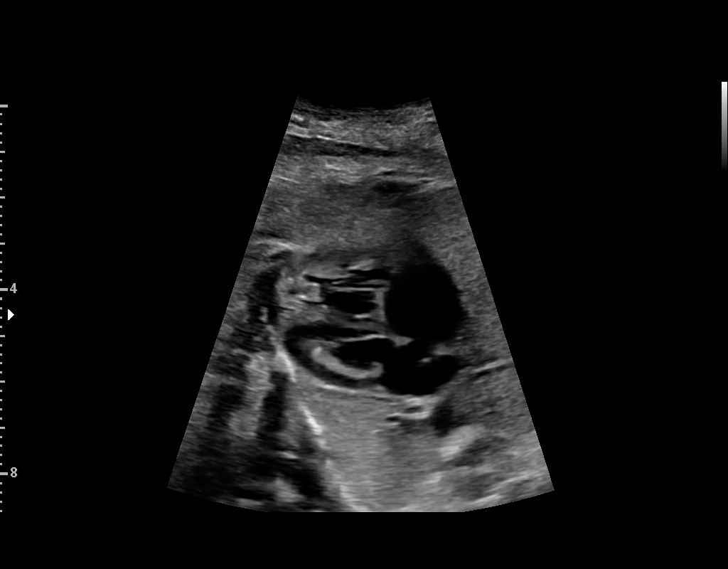
[im 8/28]
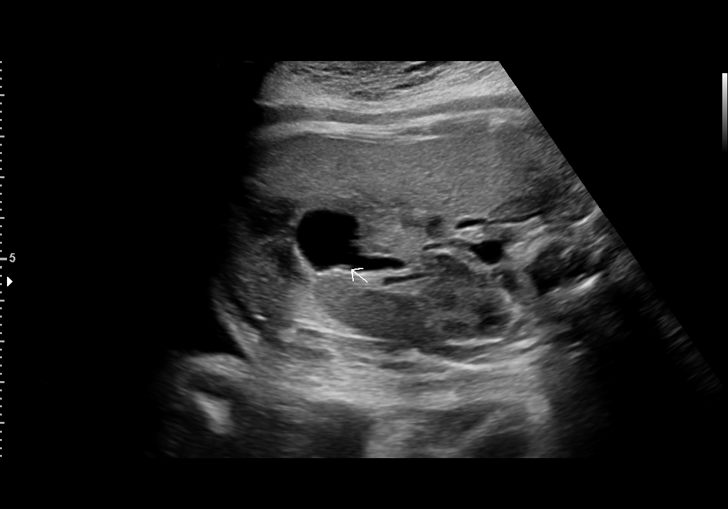
[im 10/28]
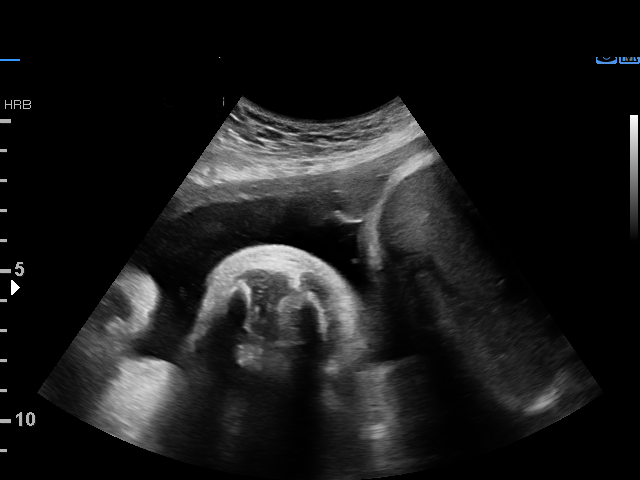
[im 12/28]
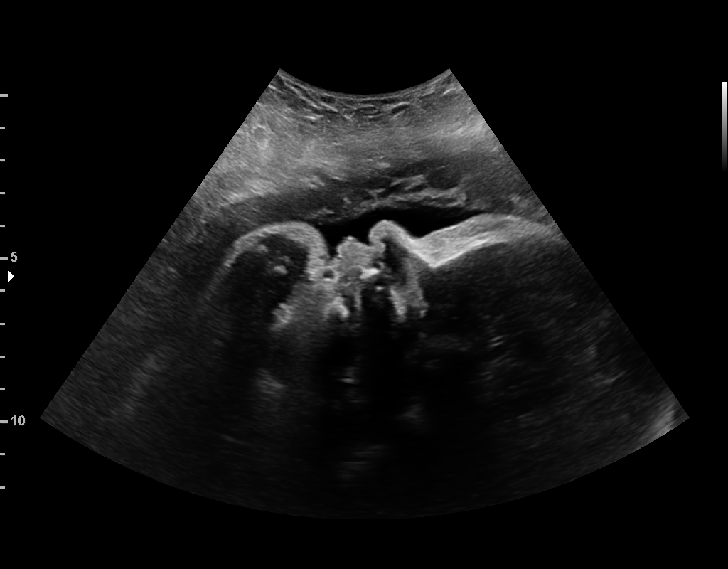
[im 14/28]
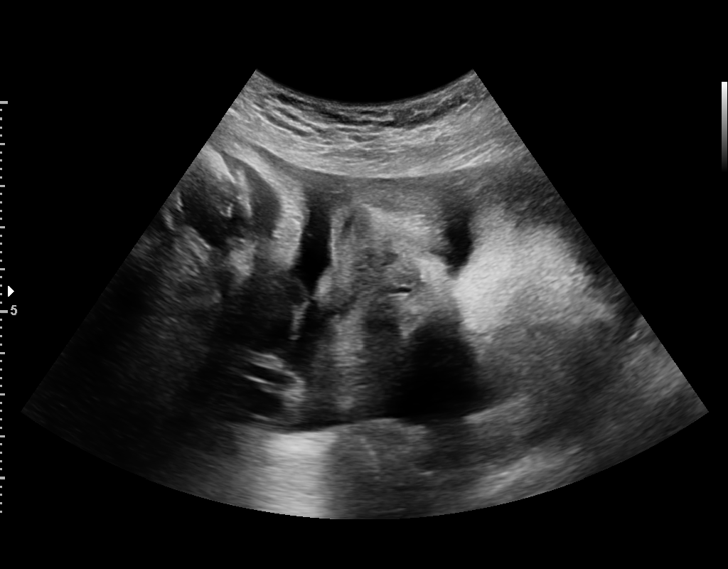
[im 16/28]
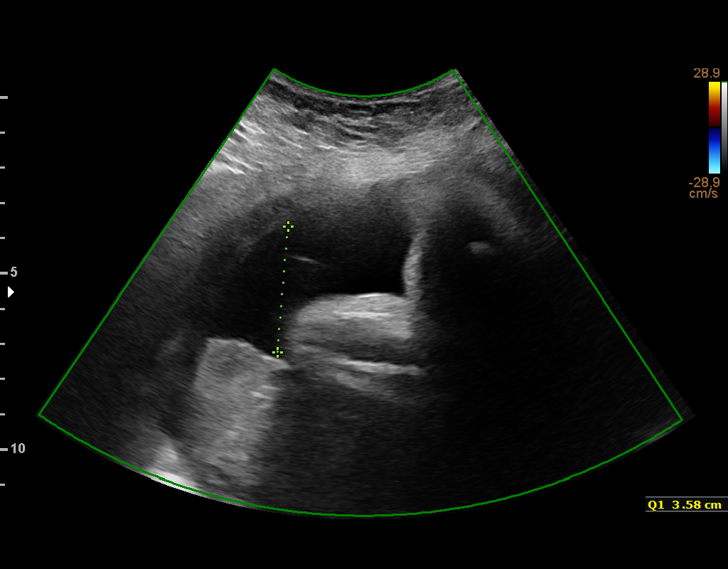
[im 18/28]
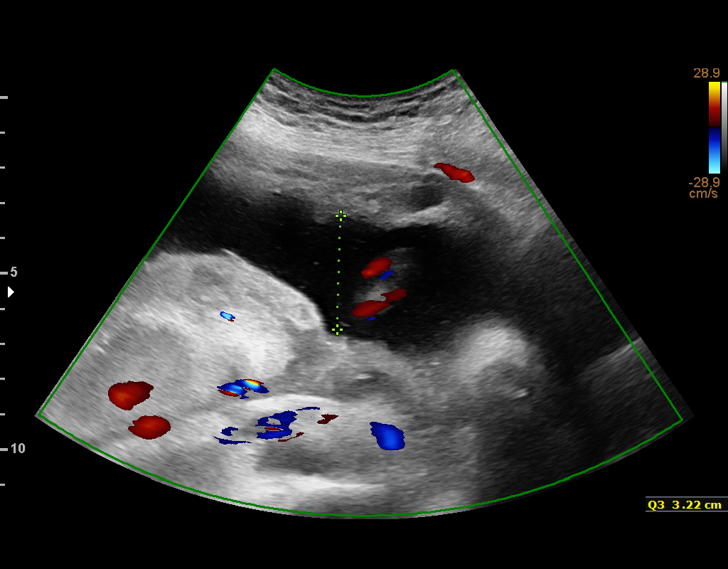
[im 20/28]
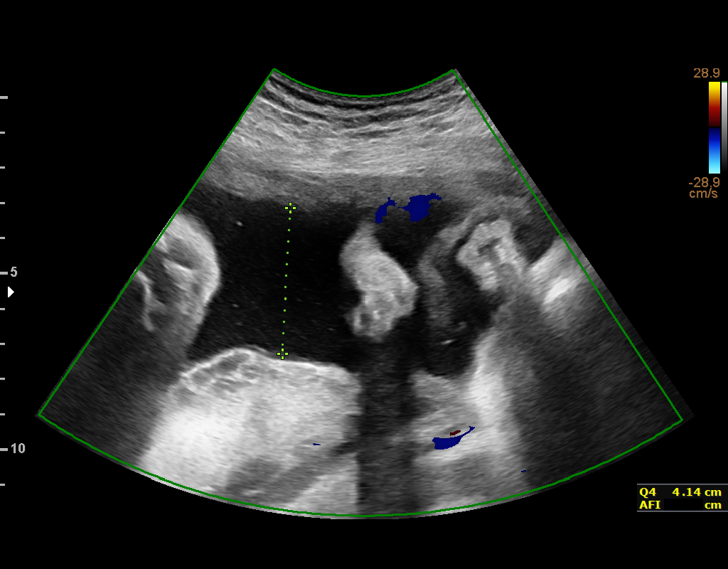
[im 22/28]
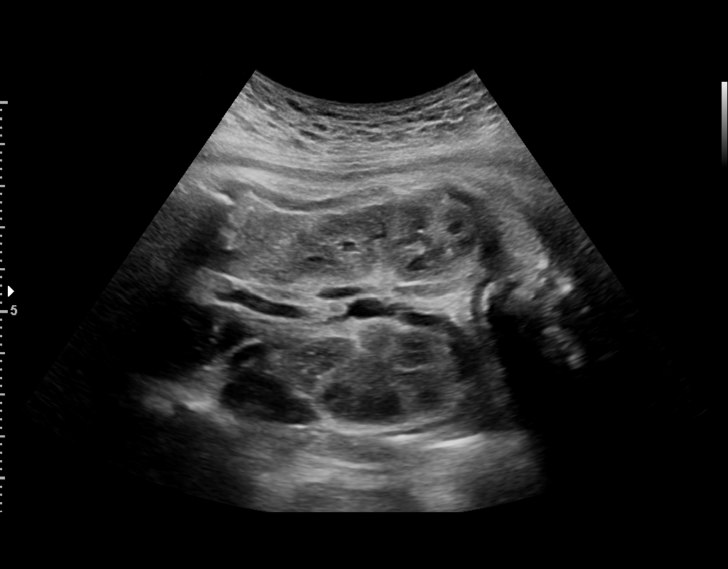
[im 24/28]
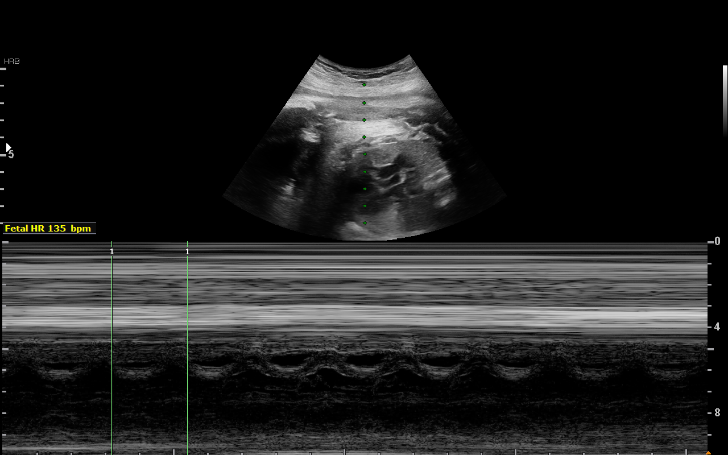
[im 26/28]
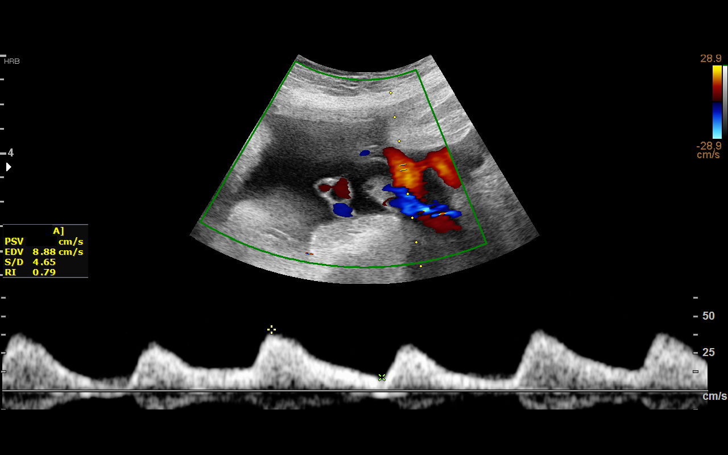
[im 28/28]
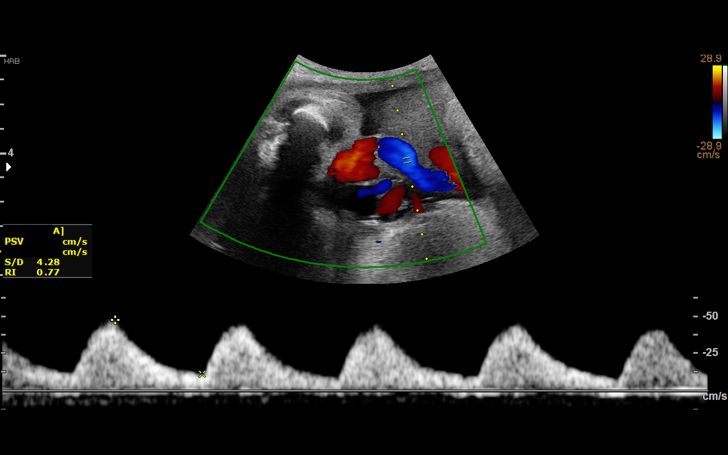

[14 of 28 positions shown; findings below may reference images not displayed]

1  SHIVCHAND FIRDOS            [id number]      [id number]     [id number]
2  SHIVCHAND FIRDOS            [id number]      [id number]     [id number]
Indications

36 weeks gestation of pregnancy
Abnormal biochemical screen (quad) for
Trisomy 21 ([DATE]) (NML NIPS)
Small for gestational age fetus affecting
management of mother
OB History

Gravidity:    2         Term:   0        Prem:   0        SAB:   1
TOP:          0       Ectopic:  0        Living: 0
Fetal Evaluation

Num Of Fetuses:     1
Fetal Heart         135
Rate(bpm):
Cardiac Activity:   Observed
Presentation:       Cephalic
Placenta:           Posterior, above cervical os
P. Cord Insertion:  Previously Visualized

Amniotic Fluid
AFI FV:      Subjectively within normal limits

AFI Sum(cm)     %Tile       Largest Pocket(cm)
15.88           59

RUQ(cm)       RLQ(cm)       LUQ(cm)        LLQ(cm)
3.58
Biophysical Evaluation

Amniotic F.V:   Within normal limits       F. Tone:        Observed
F. Movement:    Observed                   Score:          [DATE]
F. Breathing:   Observed
Gestational Age

Best:          36w 4d     Det. By:  Early Ultrasound         EDD:   07/22/17
(12/23/16)
Anatomy

Thoracic:              Appears normal         Stomach:                Appears normal, left
sided
Heart:                 Appears normal         Kidneys:                Appear normal
(4CH, axis, and situs
RVOT:                  Appears normal         Bladder:                Appears normal
Diaphragm:             Appears normal
Doppler - Fetal Vessels

Umbilical Artery
S/D     %tile                                     PSV
(cm/s)
4.28    >

Impression

SIUP at 36+4 weeks
Cephalic presentation
Normal amniotic fluid volume
BPP [DATE]
UA dopplers were elevated for this GA
Recommendations

Discussed with Dr. Felner - induction planned for 37 weeks

## 2018-12-26 DIAGNOSIS — F4010 Social phobia, unspecified: Secondary | ICD-10-CM | POA: Insufficient documentation

## 2019-02-03 ENCOUNTER — Telehealth: Payer: Self-pay

## 2019-02-03 NOTE — Telephone Encounter (Signed)
Returning pt's call to discuss concerns. PT states she thinks she has BV and also complains of painful intercourse. I recommended pt come be seen by a provider to discuss these issues. Appt made.

## 2019-02-10 DIAGNOSIS — G43109 Migraine with aura, not intractable, without status migrainosus: Secondary | ICD-10-CM | POA: Insufficient documentation

## 2019-02-11 ENCOUNTER — Telehealth: Payer: Self-pay | Admitting: *Deleted

## 2019-02-11 NOTE — Telephone Encounter (Signed)
Left patient a message to call or [facility] message answers to screening questions prior to appointment on 02/12/19 at 1:15pm.

## 2019-02-12 ENCOUNTER — Other Ambulatory Visit: Payer: Self-pay

## 2019-02-12 ENCOUNTER — Ambulatory Visit: Payer: BLUE CROSS/BLUE SHIELD | Admitting: Obstetrics & Gynecology

## 2019-02-12 ENCOUNTER — Ambulatory Visit (INDEPENDENT_AMBULATORY_CARE_PROVIDER_SITE_OTHER): Payer: BLUE CROSS/BLUE SHIELD

## 2019-02-12 ENCOUNTER — Encounter: Payer: Self-pay | Admitting: Obstetrics & Gynecology

## 2019-02-12 VITALS — BP 104/68 | HR 83 | Resp 16 | Ht 63.0 in | Wt 164.0 lb

## 2019-02-12 DIAGNOSIS — L680 Hirsutism: Secondary | ICD-10-CM | POA: Diagnosis not present

## 2019-02-12 DIAGNOSIS — R102 Pelvic and perineal pain: Secondary | ICD-10-CM

## 2019-02-12 DIAGNOSIS — Z23 Encounter for immunization: Secondary | ICD-10-CM

## 2019-02-12 MED ORDER — METRONIDAZOLE 500 MG PO TABS: 500.0000 mg | ORAL_TABLET | Freq: Two times a day (BID) | ORAL | 0 refills | Status: AC

## 2019-02-12 MED ORDER — IBUPROFEN 600 MG PO TABS: 600.0000 mg | ORAL_TABLET | Freq: Four times a day (QID) | ORAL | 1 refills | Status: DC | PRN

## 2019-02-12 NOTE — Progress Notes (Signed)
Patient ID: Shari Skinner, female   DOB: April 26, 1996, 23 y.o.   MRN: [id number]  Chief Complaint  Patient presents with  . Painful intercourse and poss BV    HPI Shari Skinner is a 23 y.o. single P31 (59 1/2 yo son) here with several issues. 1) pelvic pain. This has been present for many months, prior to IUD insertion. She says that it is worse with sitting, nothing makes it better or worse. When it bothers her, she takes 2 tylenol which helps a little. She has not tried IBU.  2) excess dark hairs on chest and hands. She shaved them off so she cannot show me today.  3) recurrent BV  Past Medical History:  Diagnosis Date  . Anxiety   . Depression     Past Surgical History:  Procedure Laterality Date  . APPENDECTOMY  2013  . WISDOM TOOTH EXTRACTION      Family History  Problem Relation Age of Onset  . Diabetes Mother   . Diabetes Father     Social History Social History   Tobacco Use  . Smoking status: Former Smoker    Packs/day: 0.50    Years: 3.00    Pack years: 1.50    Types: Cigarettes    Quit date: 09/25/2016    Years since quitting: 2.3  . Smokeless tobacco: Never Used  Substance Use Topics  . Alcohol use: No  . Drug use: No    Allergies  Allergen Reactions  . Amoxicillin Other (See Comments)    Not sure of reaction- maybe a rash- childhood allergy Has patient had a PCN reaction causing immediate rash, facial/tongue/throat swelling, SOB or lightheadedness with hypotension: Unknown Has patient had a PCN reaction causing severe rash involving mucus membranes or skin necrosis: Unknown Has patient had a PCN reaction that required hospitalization: Unknown Has patient had a PCN reaction occurring within the last 10 years: No If all of the above answers are "NO", then may proceed with Cephalospor  . Clindamycin/Lincomycin Other (See Comments)    Not sure of reaction- maybe rash  . Hydrocodone-Acetaminophen Rash  . Strawberry Leaves Extract Rash   Fruit (strawberry)    Current Outpatient Medications  Medication Sig Dispense Refill  . ibuprofen (ADVIL,MOTRIN) 600 MG tablet Take 1 tablet (600 mg total) by mouth every 6 (six) hours. 30 tablet 0  . lamoTRIgine (LAMICTAL) 200 MG tablet     . mirtazapine (REMERON) 7.5 MG tablet every evening.    . nitrofurantoin, macrocrystal-monohydrate, (MACROBID) 100 MG capsule     . PARAGARD INTRAUTERINE COPPER IU by Intrauterine route.    . SUMAtriptan (IMITREX) 25 MG tablet      No current facility-administered medications for this visit.     Review of Systems Review of Systems Pap normal 4/19  Blood pressure 104/68, pulse 83, resp. rate 16, height 5\' 3"  (1.6 m), weight 164 lb (74.4 kg), not currently breastfeeding.  Physical Exam Physical Exam Breathing, conversing, and ambulating normally Well nourished, well hydrated White female, no apparent distress Vaginal discharge Skinner/w BV normal size and shape, anteverted, mobile, non-tender, normal adnexal exam  Data Reviewed labs  Assessment/Plan Pelvic discomfort- rec IBU 600 mg TID prn, check gyn u/s Excess hair growth per patient- check free testosterone level Recurrent BV- treat with flagyl and rec boric acid supp QOD at bedtime Flu vaccine given today      Shari Skinner Shari Skinner 02/12/2019, 1:35 PM

## 2019-02-17 LAB — TESTOSTERONE, FREE, TOTAL, SHBG
Sex Hormone Binding: 48.9 nmol/L (ref 24.6–122.0)
Testosterone, Free: 1.6 pg/mL (ref 0.0–4.2)
Testosterone: 18 ng/dL (ref 8–48)

## 2019-07-27 ENCOUNTER — Ambulatory Visit: Payer: Self-pay | Admitting: Obstetrics & Gynecology

## 2019-07-27 ENCOUNTER — Other Ambulatory Visit: Payer: Self-pay

## 2019-07-27 ENCOUNTER — Encounter: Payer: Self-pay | Admitting: Obstetrics & Gynecology

## 2019-07-27 VITALS — BP 100/61 | HR 70 | Temp 98.3°F | Resp 16 | Ht 63.0 in | Wt 153.0 lb

## 2019-07-27 DIAGNOSIS — Z113 Encounter for screening for infections with a predominantly sexual mode of transmission: Secondary | ICD-10-CM

## 2019-07-27 DIAGNOSIS — Z01411 Encounter for gynecological examination (general) (routine) with abnormal findings: Secondary | ICD-10-CM

## 2019-07-27 NOTE — Addendum Note (Signed)
Addended by: Kathie Dike on: 07/27/2019 04:21 PM   Modules accepted: Orders

## 2019-07-27 NOTE — Progress Notes (Signed)
Subjective:     Shari Skinner is a 23 y.o. female here for a routine exam.  Current complaints: bump on right leg a month ago.  Wants full STD testing.     Gynecologic History Patient's last menstrual period was 07/24/2019. Contraception: paragard Last Pap: 2019. Results were: normal   Obstetric History OB History  Gravida Para Term Preterm AB Living  2 1 1   1 1   SAB TAB Ectopic Multiple Live Births  1     0 1    # Outcome Date GA Lbr Len/2nd Weight Sex Delivery Anes PTL Lv  2 Term 07/02/17 [redacted]w[redacted]d 07:11 / 00:12 5 lb 0.4 oz (2.279 kg) M Vag-Spont EPI, Local  LIV     Birth Comments: WNL  1 SAB 2014             The following portions of the patient's history were reviewed and updated as appropriate: allergies, current medications, past family history, past medical history, past social history, past surgical history and problem list.  Review of Systems Pertinent items noted in HPI and remainder of comprehensive ROS otherwise negative.    Objective:      Vitals:   07/27/19 1549  BP: 100/61  Pulse: 70  Resp: 16  Temp: 98.3 F (36.8 C)  Weight: 153 lb (69.4 kg)  Height: 5\' 3"  (1.6 m)   Vitals:  WNL General appearance: alert, cooperative and no distress  HEENT: Normocephalic, without obvious abnormality, atraumatic Eyes: negative Throat: lips, mucosa, and tongue normal; teeth and gums normal  Respiratory: Clear to auscultation bilaterally  CV: Regular rate and rhythm  Breasts:  Normal appearance, no masses or tenderness, no nipple retraction or dimpling  GI: Soft, non-tender; bowel sounds normal; no masses,  no organomegaly  GU: External Genitalia:  Tanner V, no lesion Urethra:  No prolapse   Vagina: Pink, normal rugae, no blood or discharge  Cervix: No CMT, no lesion, IUD strings seen  Uterus:  Normal size and contour, non tender  Adnexa: Normal, no masses, non tender  Musculoskeletal: No edema, redness or tenderness in the calves or thighs  Skin: Right  leg--?flesh colored nevus; left vulva--change in color and texture of nevus.    Lymphatic: Axillary adenopathy: none     Psychiatric: Normal mood and behavior        Assessment:    Healthy female exam.    Plan:  1.  Vulvar biopsy / nevus removal 2.  STD testing 3.  Pap up to date 4.  Continue paragard

## 2019-07-28 LAB — CERVICOVAGINAL ANCILLARY ONLY
Chlamydia: NEGATIVE
Comment: NEGATIVE
Comment: NEGATIVE
Comment: NORMAL
Neisseria Gonorrhea: NEGATIVE
Trichomonas: NEGATIVE

## 2019-07-28 LAB — HEPATITIS C ANTIBODY
Hepatitis C Ab: NONREACTIVE
SIGNAL TO CUT-OFF: 0.02 (ref <1.00)

## 2019-07-28 LAB — HIV ANTIBODY (ROUTINE TESTING W REFLEX): HIV 1&2 Ab, 4th Generation: NONREACTIVE

## 2019-07-28 LAB — RPR: RPR Ser Ql: NONREACTIVE

## 2019-07-28 LAB — HEPATITIS B SURFACE ANTIGEN: Hepatitis B Surface Ag: NONREACTIVE

## 2019-08-10 ENCOUNTER — Ambulatory Visit (INDEPENDENT_AMBULATORY_CARE_PROVIDER_SITE_OTHER): Payer: Self-pay | Admitting: Obstetrics & Gynecology

## 2019-08-10 ENCOUNTER — Encounter: Payer: Self-pay | Admitting: Obstetrics & Gynecology

## 2019-08-10 ENCOUNTER — Other Ambulatory Visit: Payer: Self-pay

## 2019-08-10 ENCOUNTER — Other Ambulatory Visit: Payer: Self-pay | Admitting: Obstetrics & Gynecology

## 2019-08-10 VITALS — BP 100/60 | HR 78 | Temp 98.4°F | Resp 16 | Ht 63.0 in | Wt 153.0 lb

## 2019-08-10 DIAGNOSIS — N9089 Other specified noninflammatory disorders of vulva and perineum: Secondary | ICD-10-CM

## 2019-08-10 NOTE — Progress Notes (Signed)
Patient ID: Shari Skinner, female   DOB: 1995-12-24, 24 y.o.   MRN: [id number]  Chief Complaint  Patient presents with  . Mole removal    HPI Shari Skinner is a 24 y.o. female with changing nevus on left vulva.  It also cuses irritation with wiping.    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 Anxiety   . Depression     Past Surgical History:  Procedure Laterality Date  . APPENDECTOMY  2013  . WISDOM TOOTH EXTRACTION      Family History  Problem Relation Age of Onset  . Diabetes Mother   . Diabetes Father   . Lung cancer Paternal Grandmother   . Cervical cancer Paternal Grandmother     Social History Social History   Tobacco Use  . Smoking status: Former Smoker    Packs/day: 0.50    Years: 3.00    Pack years: 1.50    Types: Cigarettes    Quit date: 09/25/2016    Years since quitting: 2.8  . Smokeless tobacco: Never Used  Substance Use Topics  . Alcohol use: No  . Drug use: No    Allergies  Allergen Reactions  . Amoxicillin Other (See Comments)    Not sure of reaction- maybe a rash- childhood allergy Has patient had a PCN reaction causing immediate rash, facial/tongue/throat swelling, SOB or lightheadedness with hypotension: Unknown Has patient had a PCN reaction causing severe rash involving mucus membranes or skin necrosis: Unknown Has patient had a PCN reaction that required hospitalization: Unknown Has patient had a PCN reaction occurring within the last 10 years: No If all of the above answers are "NO", then may proceed with Cephalospor  . Clindamycin/Lincomycin Other (See Comments)    Not sure of reaction- maybe rash  . Sertraline Other (See Comments)    Made depression worse  . Hydrocodone-Acetaminophen Rash  . Strawberry Leaves Extract Rash    Fruit (strawberry)    Current Outpatient Medications   Medication Sig Dispense Refill  . dicyclomine (BENTYL) 20 MG tablet Take by mouth.    . fluticasone (FLONASE) 50 MCG/ACT nasal spray SPRAY 1 SPRAY INTO EACH NOSTRIL EVERY DAY    . lamoTRIgine (LAMICTAL) 200 MG tablet     . mirtazapine (REMERON) 7.5 MG tablet every evening.    Marland Kitchen PARAGARD INTRAUTERINE COPPER IU by Intrauterine route.    . SUMAtriptan (IMITREX) 25 MG tablet      No current facility-administered medications for this visit.    Review of Systems Review of Systems  Blood pressure 100/60, pulse 78, temperature 98.4 F (36.9 C), resp. rate 16, height 5\' 3"  (1.6 m), weight 153 lb (69.4 kg), last menstrual period 07/24/2019.  Physical Exam Physical Exam  Data Reviewed Last note  Assessment    Prepping with Betadine Local anesthesia with 1% Buffered Lidocaine scapel used to remove lesion Silver Nitrate applied:  Yes Well tolerated  Specimen appropriately identified and sent to pathology    Plan    Follow-up:  As needed Mychart will communicate biopsy results.       07/26/2019 08/10/2019, 10:51 AM

## 2019-08-10 NOTE — Addendum Note (Signed)
Addended by: Granville Lewis on: 08/10/2019 11:03 AM   Modules accepted: Orders

## 2020-01-05 IMAGING — US US TRANSVAGINAL NON-OB
1 series · 14 of 25 positions shown · non-contrast
Comparison: Ultrasound 04/23/2018

CLINICAL DATA: Pelvic pain

EXAM:
ULTRASOUND PELVIS TRANSVAGINAL
TECHNIQUE: Transvaginal ultrasound examination of the pelvis was performed
including evaluation of the uterus, ovaries, adnexal regions, and
pelvic cul-de-sac.

[Series 1: us transvaginal non-ob · 0.08mm/px · 14 of 172 slices shown]
[im 1/172]
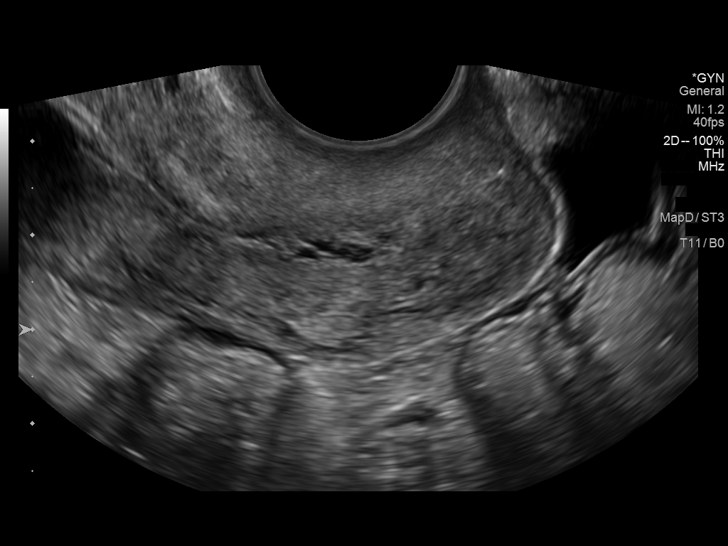
[im 15/172]
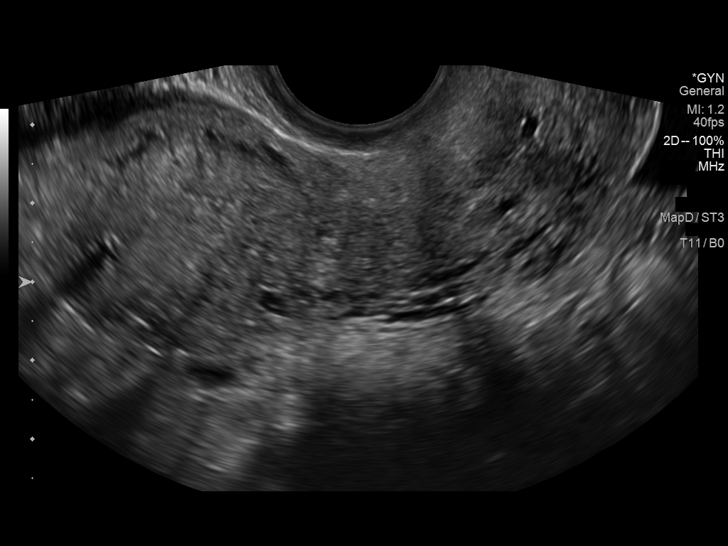
[im 29/172]
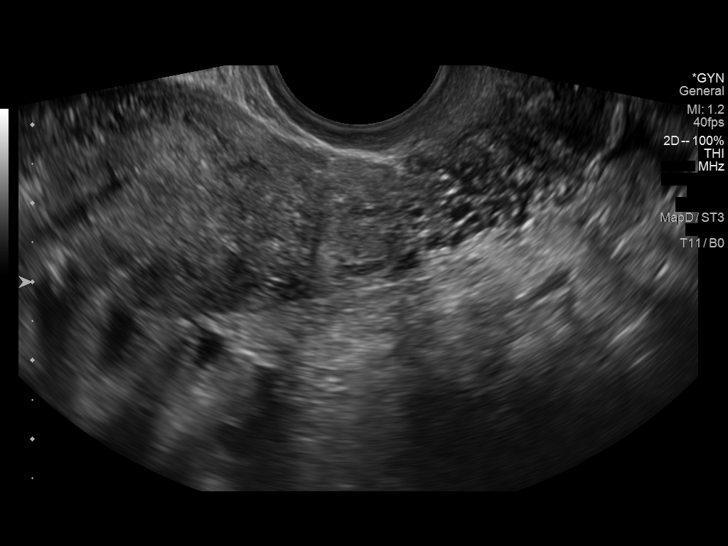
[im 43/172]
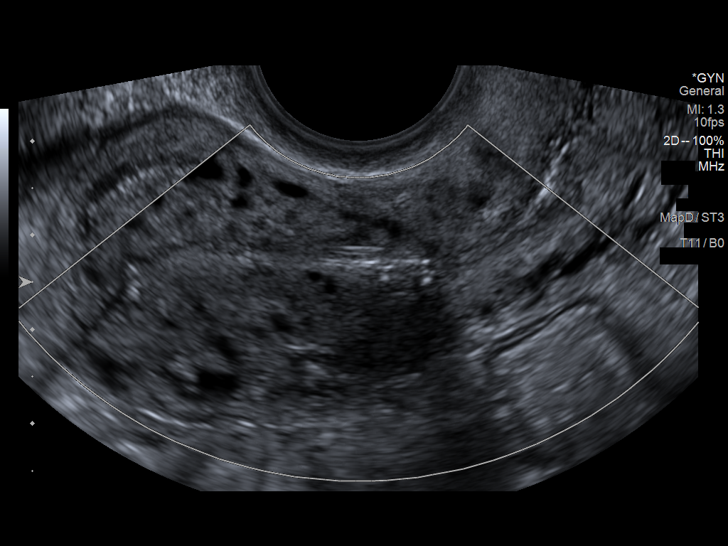
[im 58/172]
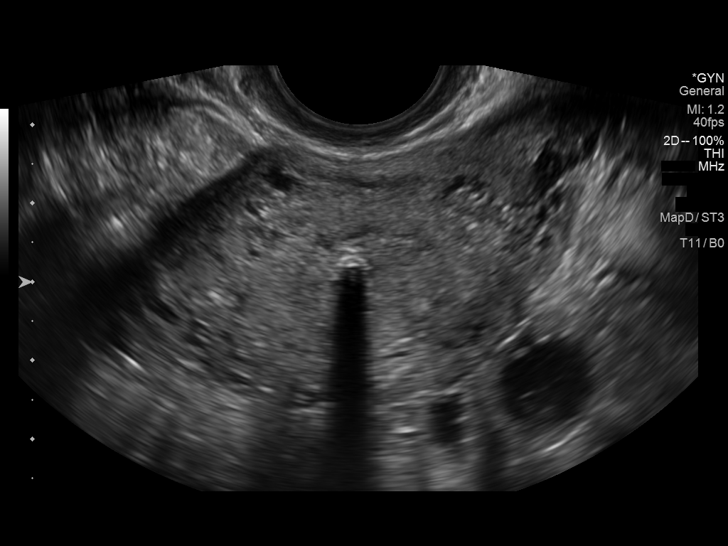
[im 65/172]
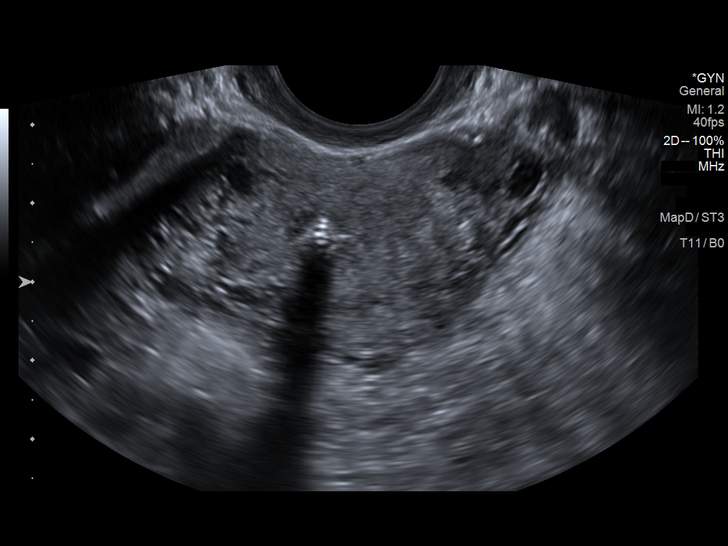
[im 79/172]
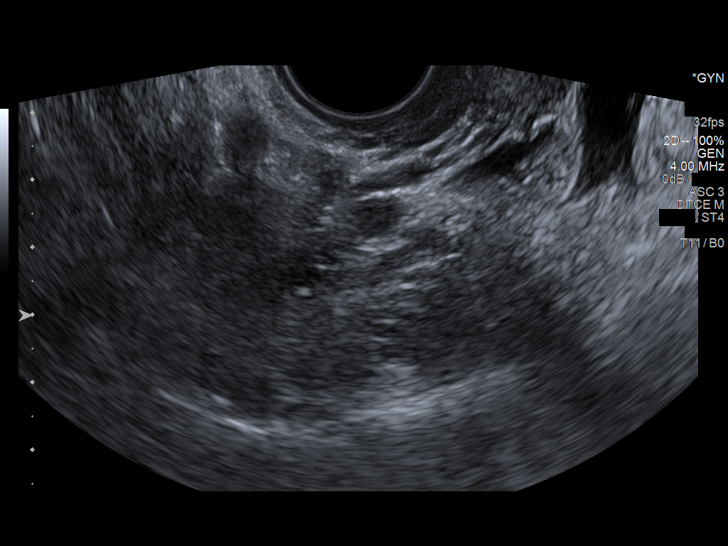
[im 93/172]
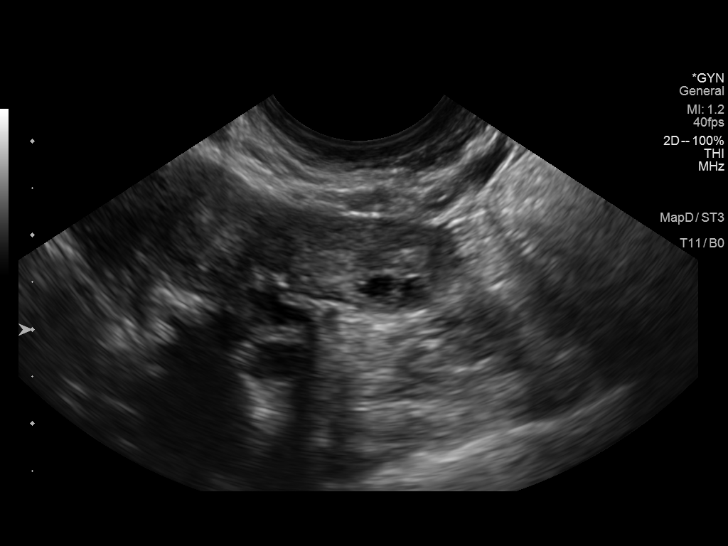
[im 107/172]
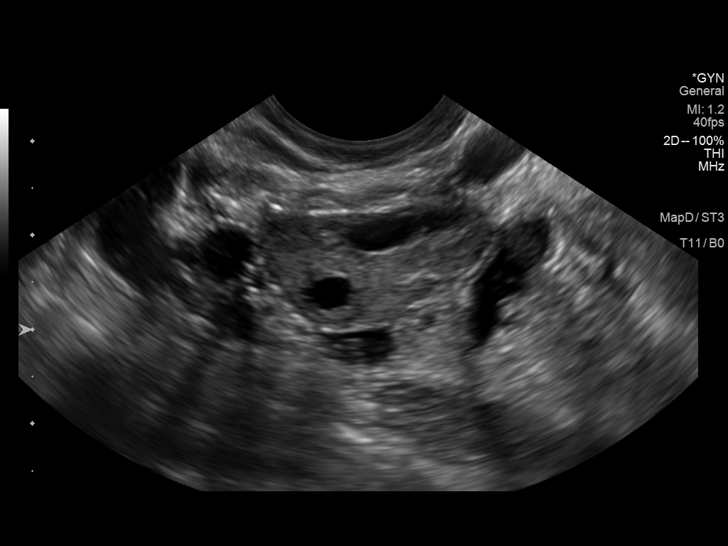
[im 115/172]
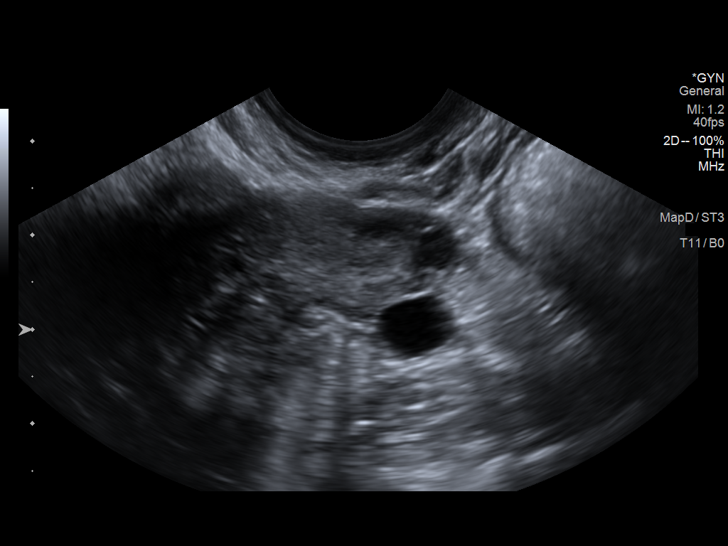
[im 129/172]
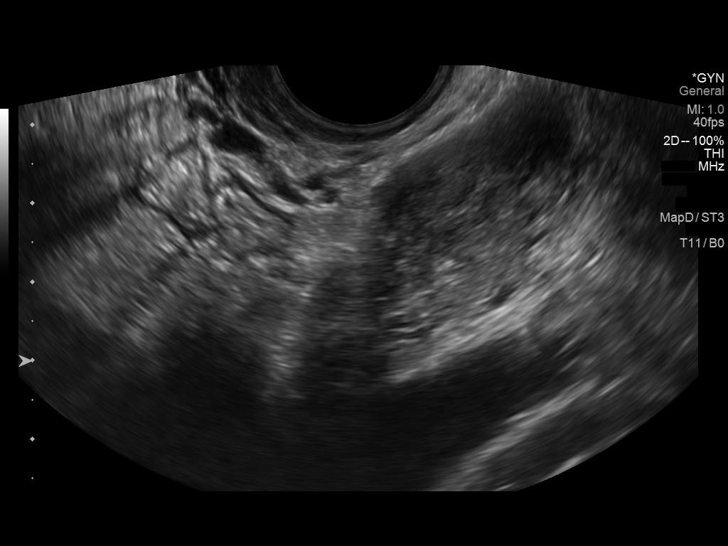
[im 143/172]
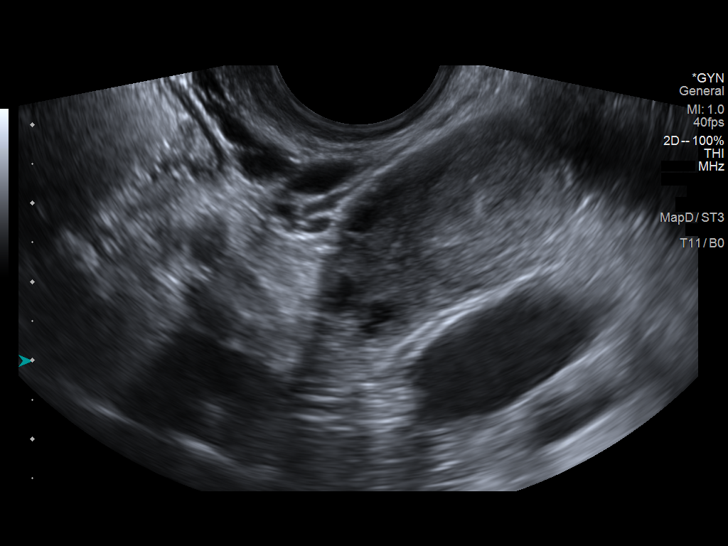
[im 157/172]
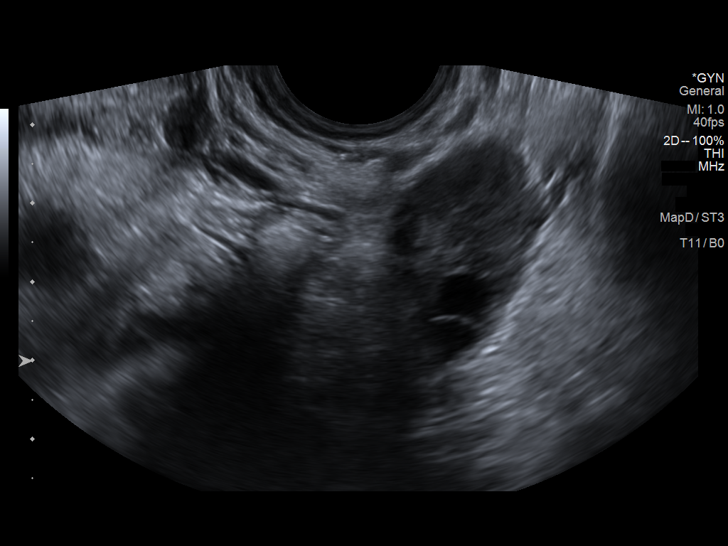
[im 172/172]
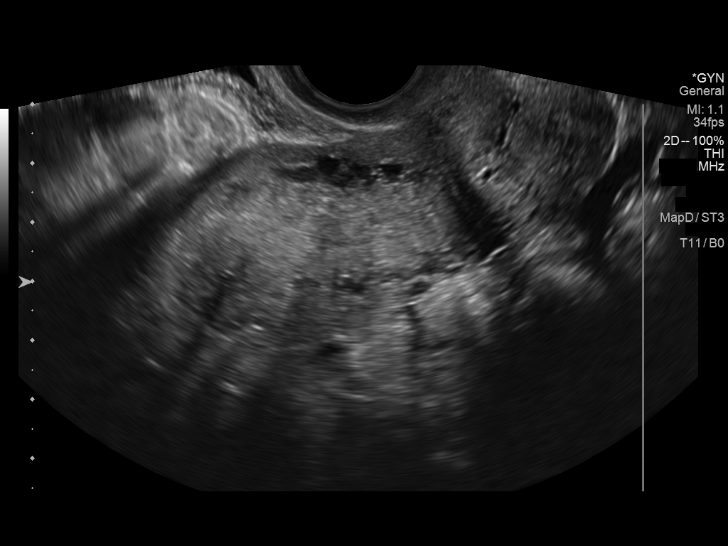

[14 of 25 positions shown; findings below may reference images not displayed]

FINDINGS: Uterus

Measurements: 8 x 3.4 x 5.4 cm = volume: 77 mL. No fibroids or other
mass visualized.

Endometrium

Thickness: 8.6 mm.  IUD within the endometrium

Right ovary

Measurements: 2.9 x 1.3 x 2.5 cm = volume: 5 mL. 8 mm exophytic cyst
versus tiny paraovarian cyst.

Left ovary

Measurements: 4.2 x 2.1 x 2.4 cm = volume: 11 mL. Echogenic area
measuring 2.2 x 1.7 x 2.1 cm

Other findings:  Small free fluid
IMPRESSION: 1. 2.2 cm echogenic area and left ovary, possibly representing
corpus luteum or potentially small endometrioma. Suggest ultrasound
follow-up in 6-12 weeks.
2. Small free fluid
3. IUD appears appropriately positioned by sonography.

## 2020-05-04 ENCOUNTER — Emergency Department (INDEPENDENT_AMBULATORY_CARE_PROVIDER_SITE_OTHER)
Admission: EM | Admit: 2020-05-04 | Discharge: 2020-05-04 | Disposition: A | Discharge status: Home / Self Care | Payer: Medicaid Other | Source: Home / Self Care | Attending: Family Medicine | Admitting: Family Medicine

## 2020-05-04 ENCOUNTER — Other Ambulatory Visit: Payer: Self-pay

## 2020-05-04 DIAGNOSIS — H00011 Hordeolum externum right upper eyelid: Secondary | ICD-10-CM

## 2020-05-04 HISTORY — DX: Migraine, unspecified, not intractable, without status migrainosus: G43.909

## 2020-05-04 MED ORDER — TOBRAMYCIN 0.3 % OP SOLN: 1.0000 [drp] | Freq: Four times a day (QID) | OPHTHALMIC | 0 refills | Status: DC

## 2020-05-04 NOTE — ED Triage Notes (Signed)
Pt presents to Urgent Care with pain and swelling to R upper eyelid. Pt reports it started getting sore 2 days ago and the swelling began yesterday; redness and drainage also noted.

## 2020-05-04 NOTE — ED Provider Notes (Signed)
[facility]   [id number] 05/04/20 Arrival Time: 1609  ASSESSMENT & PLAN:  1. Hordeolum externum of right upper eyelid     Begin: Meds ordered this encounter  Medications  . tobramycin (TOBREX) 0.3 % ophthalmic solution    Sig: Place 1 drop into the right eye every 6 (six) hours.    Dispense:  5 mL    Refill:  0    Ophthalmic drops per orders. Warm compress to eye(s). Local eye care discussed.  Reviewed expectations re: course of current medical issues. Questions answered. Outlined signs and symptoms indicating need for more acute intervention. Patient verbalized understanding. After Visit Summary given.   SUBJECTIVE:  Shari Skinner is a 24 y.o. female who presents with complaint of R upper eyelid swelling and discomfort. Onset 2 d ago; gradually worsening. Slight watery drainage. Injury: no. Visual changes: no. Contact lens use: no. Recent illness: no. Self treatment: none reported.   OBJECTIVE:  Vitals:   05/04/20 1623 05/04/20 1627  BP:  110/72  Pulse:  80  Resp:  18  Temp:  98.5 F (36.9 C)  TempSrc:  Oral  SpO2:  100%  Weight: 66.7 kg   Height: 5\' 3"  (1.6 m)     General appearance: alert; no distress HEENT: [place]; AT; PERRLA; no restriction of the extraocular movements OS: normal OD: without reported pain; with very slight conjunctival injection; with watery drainage; without gross corneal opacities; without limbal flush; upper eyelid with developing stye Neck: supple Skin: warm and dry Psychological: alert and cooperative; normal mood and affect   Allergies  Allergen Reactions  . Amoxicillin Other (See Comments)    Not sure of reaction- maybe a rash- childhood allergy Has patient had a PCN reaction causing immediate rash, facial/tongue/throat swelling, SOB or lightheadedness with hypotension: Unknown Has patient had a PCN reaction causing severe rash involving mucus membranes or skin necrosis: Unknown Has patient had a PCN  reaction that required hospitalization: Unknown Has patient had a PCN reaction occurring within the last 10 years: No If all of the above answers are "NO", then may proceed with Cephalospor  . Clindamycin/Lincomycin Other (See Comments)    Not sure of reaction- maybe rash  . Sertraline Other (See Comments)    Made depression worse  . Hydrocodone-Acetaminophen Rash  . Strawberry Leaves Extract Rash    Fruit (strawberry)    Past Medical History:  Diagnosis Date  . Anxiety   . Depression   . Migraines    Social History   Socioeconomic History  . Marital status: Single    Spouse name: Not on file  . Number of children: Not on file  . Years of education: Not on file  . Highest education level: Not on file  Occupational History  . Not on file  Tobacco Use  . Smoking status: Former Smoker    Packs/day: 0.50    Years: 3.00    Pack years: 1.50    Types: Cigarettes    Quit date: 09/25/2016    Years since quitting: 3.6  . Smokeless tobacco: Never Used  Vaping Use  . Vaping Use: Never used  Substance and Sexual Activity  . Alcohol use: No  . Drug use: No  . Sexual activity: Yes    Birth control/protection: I.U.D.  Other Topics Concern  . Not on file  Social History Narrative  . Not on file   Social Determinants of Health   Financial Resource Strain:   . Difficulty of Paying Living Expenses: Not  on file  Food Insecurity:   . Worried About Programme researcher, broadcasting/film/video in the Last Year: Not on file  . Ran Out of Food in the Last Year: Not on file  Transportation Needs:   . Lack of Transportation (Medical): Not on file  . Lack of Transportation (Non-Medical): Not on file  Physical Activity:   . Days of Exercise per Week: Not on file  . Minutes of Exercise per Session: Not on file  Stress:   . Feeling of Stress : Not on file  Social Connections:   . Frequency of Communication with Friends and Family: Not on file  . Frequency of Social Gatherings with Friends and Family: Not on  file  . Attends Religious Services: Not on file  . Active Member of Clubs or Organizations: Not on file  . Attends Banker Meetings: Not on file  . Marital Status: Not on file  Intimate Partner Violence:   . Fear of Current or Ex-Partner: Not on file  . Emotionally Abused: Not on file  . Physically Abused: Not on file  . Sexually Abused: Not on file   Family History  Problem Relation Age of Onset  . Diabetes Mother   . Diabetes Father   . Lung cancer Paternal Grandmother   . Cervical cancer Paternal Grandmother    Past Surgical History:  Procedure Laterality Date  . APPENDECTOMY  2013  . WISDOM TOOTH EXTRACTION       Mardella Layman, MD 05/04/20 [phone]

## 2020-05-19 DIAGNOSIS — F319 Bipolar disorder, unspecified: Secondary | ICD-10-CM | POA: Insufficient documentation

## 2020-05-23 ENCOUNTER — Emergency Department: Admit: 2020-05-23 | Discharge status: Absent without leave | Payer: Self-pay

## 2020-05-23 ENCOUNTER — Emergency Department (INDEPENDENT_AMBULATORY_CARE_PROVIDER_SITE_OTHER)
Admission: EM | Admit: 2020-05-23 | Discharge: 2020-05-23 | Disposition: A | Discharge status: Home / Self Care | Payer: Medicaid Other | Source: Home / Self Care

## 2020-05-23 ENCOUNTER — Encounter: Payer: Self-pay | Admitting: Emergency Medicine

## 2020-05-23 ENCOUNTER — Other Ambulatory Visit: Payer: Self-pay

## 2020-05-23 DIAGNOSIS — Z20822 Contact with and (suspected) exposure to covid-19: Secondary | ICD-10-CM | POA: Diagnosis not present

## 2020-05-23 DIAGNOSIS — U071 COVID-19: Secondary | ICD-10-CM | POA: Diagnosis not present

## 2020-05-23 LAB — POC SARS CORONAVIRUS 2 AG -  ED: SARS Coronavirus 2 Ag: POSITIVE — AB

## 2020-05-23 MED ORDER — BENZONATATE 100 MG PO CAPS: 100.0000 mg | ORAL_CAPSULE | Freq: Three times a day (TID) | ORAL | 0 refills | Status: DC | PRN

## 2020-05-23 NOTE — ED Provider Notes (Signed)
[facility]    CSN: [id number] Arrival date & time: 05/23/20  1051      History   Chief Complaint Chief Complaint  Patient presents with  . Generalized Body Aches    HPI Shari Skinner is a 24 y.o. female.   This is an established [facility] urgent care patient.  She presents with symptoms of Covid. Body aches, chills, fever, SOB, headache, x 4 days Exposed to COVID. Unvaccinated  Patient has a 12-year-old who is immunocompromised.  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  He is now on prednisone.  Patient had a family gathering last week.  The night of the gathering, her niece developed symptoms and subsequently entire family that was visiting developed Covid positive test results.     Past Medical History:  Diagnosis Date  . Anxiety   . Depression   . Migraines     Patient Active Problem List   Diagnosis Date Noted  . Presence of IUD 06/30/2018  . Anxiety and depression 02/22/2017  . MDD (major depressive disorder) 08/18/2012    Past Surgical History:  Procedure Laterality Date  . APPENDECTOMY  2013  . WISDOM TOOTH EXTRACTION      OB History    Gravida  2   Para  1   Term  1   Preterm      AB  1   Living  1     SAB  1   IAB      Ectopic      Multiple  0   Live Births  1            Home Medications    Prior to Admission medications   Medication Sig Start Date End Date Taking? Authorizing Provider  benzonatate (TESSALON) 100 MG capsule Take 1-2 capsules (100-200 mg total) by mouth 3 (three) times daily as needed for cough. 05/23/20  Yes Elvina Sidle, MD  PARAGARD INTRAUTERINE COPPER IU by Intrauterine route.    [provider]  dicyclomine (BENTYL) 20 MG tablet Take by mouth. 05/06/19 05/04/20  [provider]  fluticasone (FLONASE) 50 MCG/ACT nasal spray SPRAY 1 SPRAY INTO EACH NOSTRIL EVERY DAY 04/03/19 05/04/20  [provider]  lamoTRIgine (LAMICTAL)  200 MG tablet  01/22/19 05/04/20  [provider]  mirtazapine (REMERON) 7.5 MG tablet every evening. 01/18/19 05/04/20  [provider]  SUMAtriptan (IMITREX) 25 MG tablet  02/09/19 05/04/20  [provider]    Family History Family History  Problem Relation Age of Onset  . Diabetes Mother   . Diabetes Father   . Lung cancer Paternal Grandmother   . Cervical cancer Paternal Grandmother     Social History Social History   Tobacco Use  . Smoking status: Former Smoker    Packs/day: 0.50    Years: 3.00    Pack years: 1.50    Types: Cigarettes    Quit date: 09/25/2016    Years since quitting: 3.6  . Smokeless tobacco: Never Used  Vaping Use  . Vaping Use: Never used  Substance Use Topics  . Alcohol use: No  . Drug use: No     Allergies   Amoxicillin, Clindamycin/lincomycin, Sertraline, Hydrocodone-acetaminophen, and Strawberry leaves extract   Review of Systems Review of Systems  HENT: Positive for congestion.   Respiratory: Positive for cough.      Physical Exam Triage Vital Signs ED Triage Vitals  Enc Vitals Group     BP 05/23/20  1121 121/80     Pulse Rate 05/23/20 1121 75     Resp --      Temp 05/23/20 1121 98.2 F (36.8 C)     Temp Source 05/23/20 1121 Oral     SpO2 05/23/20 1121 100 %     Weight 05/23/20 1122 153 lb (69.4 kg)     Height 05/23/20 1122 5\' 3"  (1.6 m)     Head Circumference --      Peak Flow --      Pain Score 05/23/20 1122 8     Pain Loc --      Pain Edu? --      Excl. in GC? --    No data found.  Updated Vital Signs BP 121/80 (BP Location: Right Arm)   Pulse 75   Temp 98.2 F (36.8 C) (Oral)   Ht 5\' 3"  (1.6 m)   Wt 69.4 kg   SpO2 100%   BMI 27.10 kg/m    Physical Exam Vitals and nursing note reviewed.  Constitutional:      Appearance: Normal appearance. She is normal weight. She is not ill-appearing.  HENT:     Head: Normocephalic.     Nose: Congestion present.  Eyes:     Conjunctiva/sclera:  Conjunctivae normal.  Cardiovascular:     Pulses: Normal pulses.     Heart sounds: Normal heart sounds.  Pulmonary:     Effort: Pulmonary effort is normal.     Breath sounds: Normal breath sounds.  Musculoskeletal:        General: Normal range of motion.     Cervical back: Normal range of motion and neck supple.  Skin:    General: Skin is warm and dry.  Neurological:     General: No focal deficit present.     Mental Status: She is alert and oriented to person, place, and time.  Psychiatric:        Mood and Affect: Mood normal.      UC Treatments / Results  Labs (all labs ordered are listed, but only abnormal results are displayed) Labs Reviewed  POC SARS CORONAVIRUS 2 AG -  ED - Abnormal; Notable for the following components:      Result Value   SARS Coronavirus 2 Ag Positive (*)    All other components within normal limits    EKG   Radiology No results found.  Procedures Procedures (including critical care time)  Medications Ordered in UC Medications - No data to display  Initial Impression / Assessment and Plan / UC Course  I have reviewed the triage vital signs and the nursing notes.  Pertinent labs & imaging results that were available during my care of the patient were reviewed by me and considered in my medical decision making (see chart for details).    Final Clinical Impressions(s) / UC Diagnoses   Final diagnoses:  Exposure to COVID-19 virus  COVID-19     Discharge Instructions     Your test is positive for Covid.  You need to stay separate from your immunocompromised son for the next week    ED Prescriptions    Medication Sig Dispense Auth. Provider   benzonatate (TESSALON) 100 MG capsule Take 1-2 capsules (100-200 mg total) by mouth 3 (three) times daily as needed for cough. 40 capsule 05/25/20, MD     PDMP not reviewed this encounter.   , MD 05/23/20 1200

## 2020-05-23 NOTE — ED Triage Notes (Signed)
Body aches, chills, fever, SOB, headache, x 4 days Exposed to COVID. Unvaccinated

## 2020-05-23 NOTE — Discharge Instructions (Addendum)
Your test is positive for Covid.  You need to stay separate from your immunocompromised son for the next week

## 2021-06-12 ENCOUNTER — Ambulatory Visit (INDEPENDENT_AMBULATORY_CARE_PROVIDER_SITE_OTHER): Payer: Medicaid Other | Admitting: Family Medicine

## 2021-06-12 ENCOUNTER — Other Ambulatory Visit (HOSPITAL_COMMUNITY)
Admission: RE | Admit: 2021-06-12 | Discharge: 2021-06-12 | Disposition: A | Discharge status: Home / Self Care | Payer: Medicaid Other | Source: Ambulatory Visit | Attending: Family Medicine | Admitting: Family Medicine

## 2021-06-12 ENCOUNTER — Other Ambulatory Visit: Payer: Self-pay

## 2021-06-12 ENCOUNTER — Encounter: Payer: Self-pay | Admitting: Family Medicine

## 2021-06-12 VITALS — BP 117/72 | HR 89 | Resp 16 | Ht 63.0 in | Wt 152.0 lb

## 2021-06-12 DIAGNOSIS — Z01419 Encounter for gynecological examination (general) (routine) without abnormal findings: Secondary | ICD-10-CM

## 2021-06-12 DIAGNOSIS — Z113 Encounter for screening for infections with a predominantly sexual mode of transmission: Secondary | ICD-10-CM

## 2021-06-12 DIAGNOSIS — Z124 Encounter for screening for malignant neoplasm of cervix: Secondary | ICD-10-CM | POA: Diagnosis not present

## 2021-06-12 DIAGNOSIS — T8332XA Displacement of intrauterine contraceptive device, initial encounter: Secondary | ICD-10-CM | POA: Diagnosis not present

## 2021-06-12 DIAGNOSIS — Z30431 Encounter for routine checking of intrauterine contraceptive device: Secondary | ICD-10-CM | POA: Diagnosis not present

## 2021-06-12 NOTE — Progress Notes (Signed)
Subjective:     Shari Skinner is a 26 y.o. female and is here for a comprehensive physical exam. The patient reports no problems. Has Paragard in place--cannot feel strings. Cycles are crampy, but not heavy and very regular. Desires STD screen today.   The following portions of the patient's history were reviewed and updated as appropriate: allergies, current medications, past family history, past medical history, past social history, past surgical history, and problem list.  Review of Systems Pertinent items noted in HPI and remainder of comprehensive ROS otherwise negative.   Objective:  Chaperone present for exam   BP 117/72    Pulse 89    Resp 16    Ht 5\' 3"  (1.6 m)    Wt 152 lb (68.9 kg)    LMP 06/01/2021    BMI 26.93 kg/m  General appearance: alert, cooperative, and appears stated age Head: Normocephalic, without obvious abnormality, atraumatic Neck: no adenopathy, supple, symmetrical, trachea midline, and thyroid not enlarged, symmetric, no tenderness/mass/nodules Lungs: clear to auscultation bilaterally Breasts: normal appearance, no masses or tenderness, fibrocystic change noted bilaterally Heart: regular rate and rhythm, S1, S2 normal, no murmur, click, rub or gallop Abdomen: soft, non-tender; bowel sounds normal; no masses,  no organomegaly Pelvic: cervix normal in appearance, external genitalia normal, no adnexal masses or tenderness, no cervical motion tenderness, uterus normal size, shape, and consistency, vagina normal without discharge, and IUD strings are not noted Extremities: extremities normal, atraumatic, no cyanosis or edema Pulses: 2+ and symmetric Skin: Skin color, texture, turgor normal. No rashes or lesions Lymph nodes: Cervical, supraclavicular, and axillary nodes normal. Neurologic: Grossly normal    TVUS reveals IUD in place at fundus Assessment:    Healthy female exam.      Plan:  Screening for malignant neoplasm of cervix - Plan: Cytology -  PAP( [place])  Encounter for gynecological examination without abnormal finding  Screen for STD (sexually transmitted disease) - Plan: Hepatitis B surface antigen, Hepatitis C antibody, HIV Antibody (routine testing w rflx), RPR  IUD check up - Strings not noted--placement confirmed with limited GYN u/s    Return in 1 year (on 06/12/2022).  See After Visit Summary for Counseling Recommendations

## 2021-06-12 NOTE — Patient Instructions (Signed)
Preventive Care 21-25 Years Old, Female °Preventive care refers to lifestyle choices and visits with your health care provider that can promote health and wellness. Preventive care visits are also called wellness exams. °What can I expect for my preventive care visit? °Counseling °During your preventive care visit, your health care provider may ask about your: °Medical history, including: °Past medical problems. °Family medical history. °Pregnancy history. °Current health, including: °Menstrual cycle. °Method of birth control. °Emotional well-being. °Home life and relationship well-being. °Sexual activity and sexual health. °Lifestyle, including: °Alcohol, nicotine or tobacco, and drug use. °Access to firearms. °Diet, exercise, and sleep habits. °Work and work environment. °Sunscreen use. °Safety issues such as seatbelt and bike helmet use. °Physical exam °Your health care provider may check your: °Height and weight. These may be used to calculate your BMI (body mass index). BMI is a measurement that tells if you are at a healthy weight. °Waist circumference. This measures the distance around your waistline. This measurement also tells if you are at a healthy weight and may help predict your risk of certain diseases, such as type 2 diabetes and high blood pressure. °Heart rate and blood pressure. °Body temperature. °Skin for abnormal spots. °What immunizations do I need? °Vaccines are usually given at various ages, according to a schedule. Your health care provider will recommend vaccines for you based on your age, medical history, and lifestyle or other factors, such as travel or where you work. °What tests do I need? °Screening °Your health care provider may recommend screening tests for certain conditions. This may include: °Pelvic exam and Pap test. °Lipid and cholesterol levels. °Diabetes screening. This is done by checking your blood sugar (glucose) after you have not eaten for a while (fasting). °Hepatitis B  test. °Hepatitis C test. °HIV (human immunodeficiency virus) test. °STI (sexually transmitted infection) testing, if you are at risk. °BRCA-related cancer screening. This may be done if you have a family history of breast, ovarian, tubal, or peritoneal cancers. °Talk with your health care provider about your test results, treatment options, and if necessary, the need for more tests. °Follow these instructions at home: °Eating and drinking ° °Eat a healthy diet that includes fresh fruits and vegetables, whole grains, lean protein, and low-fat dairy products. °Take vitamin and mineral supplements as recommended by your health care provider. °Do not drink alcohol if: °Your health care provider tells you not to drink. °You are pregnant, may be pregnant, or are planning to become pregnant. °If you drink alcohol: °Limit how much you have to 0-1 drink a day. °Know how much alcohol is in your drink. In the [place] one drink equals one 12 oz bottle of beer (355 mL), one 5 oz glass of wine (148 mL), or one 1½ oz glass of hard liquor (44 mL). °Lifestyle °Brush your teeth every morning and night with fluoride toothpaste. Floss one time each day. °Exercise for at least 30 minutes 5 or more days each week. °Do not use any products that contain nicotine or tobacco. These products include cigarettes, chewing tobacco, and vaping devices, such as e-cigarettes. If you need help quitting, ask your health care provider. °Do not use drugs. °If you are sexually active, practice safe sex. Use a condom or other form of protection to prevent STIs. °If you do not wish to become pregnant, use a form of birth control. If you plan to become pregnant, see your health care provider for a prepregnancy visit. °Find healthy ways to manage stress, such as: °Meditation, yoga,   or listening to music. °Journaling. °Talking to a trusted person. °Spending time with friends and family. °Minimize exposure to UV radiation to reduce your risk of skin  cancer. °Safety °Always wear your seat belt while driving or riding in a vehicle. °Do not drive: °If you have been drinking alcohol. Do not ride with someone who has been drinking. °If you have been using any mind-altering substances or drugs. °While texting. °When you are tired or distracted. °Wear a helmet and other protective equipment during sports activities. °If you have firearms in your house, make sure you follow all gun safety procedures. °Seek help if you have been physically or sexually abused. °What's next? °Go to your health care provider once a year for an annual wellness visit. °Ask your health care provider how often you should have your eyes and teeth checked. °Stay up to date on all vaccines. °This information is not intended to replace advice given to you by your health care provider. Make sure you discuss any questions you have with your health care provider. °Document Revised: 11/09/2020 Document Reviewed: 11/09/2020 °Elsevier Patient Education © 2022 [employer] ° °

## 2021-06-13 LAB — HEPATITIS C ANTIBODY
Hepatitis C Ab: NONREACTIVE
SIGNAL TO CUT-OFF: 0.07 (ref <1.00)

## 2021-06-13 LAB — RPR: RPR Ser Ql: NONREACTIVE

## 2021-06-13 LAB — HEPATITIS B SURFACE ANTIGEN: Hepatitis B Surface Ag: NONREACTIVE

## 2021-06-13 LAB — HIV ANTIBODY (ROUTINE TESTING W REFLEX): HIV 1&2 Ab, 4th Generation: NONREACTIVE

## 2021-06-14 LAB — CYTOLOGY - PAP
Chlamydia: NEGATIVE
Comment: NEGATIVE
Comment: NORMAL
Diagnosis: NEGATIVE
Neisseria Gonorrhea: NEGATIVE

## 2021-11-14 ENCOUNTER — Telehealth: Payer: Self-pay | Admitting: *Deleted

## 2021-11-14 ENCOUNTER — Ambulatory Visit: Payer: Medicaid Other

## 2021-11-14 NOTE — Telephone Encounter (Signed)
Returned call from 11/13/2021 at 3:33 PM, on PAL, voicemail was not checked. Patient left a voicemail on 11/13/2021 at 3:33 PM to cancel appointment.

## 2021-12-04 ENCOUNTER — Ambulatory Visit: Payer: Medicaid Other | Admitting: Obstetrics & Gynecology

## 2021-12-04 ENCOUNTER — Other Ambulatory Visit (HOSPITAL_COMMUNITY)
Admission: RE | Admit: 2021-12-04 | Discharge: 2021-12-04 | Disposition: A | Discharge status: Home / Self Care | Payer: Medicaid Other | Source: Ambulatory Visit

## 2021-12-04 ENCOUNTER — Encounter: Payer: Self-pay | Admitting: Obstetrics & Gynecology

## 2021-12-04 VITALS — BP 114/77 | HR 85 | Ht 63.0 in | Wt 165.0 lb

## 2021-12-04 DIAGNOSIS — N939 Abnormal uterine and vaginal bleeding, unspecified: Secondary | ICD-10-CM

## 2021-12-04 DIAGNOSIS — T8383XS Hemorrhage of genitourinary prosthetic devices, implants and grafts, sequela: Secondary | ICD-10-CM | POA: Diagnosis not present

## 2021-12-04 NOTE — Progress Notes (Signed)
   Subjective:    Patient ID: Shari Skinner, female    DOB: 1995-09-29, 26 y.o.   MRN: [id number]  HPI  26 yo female presents for IUD removal.  Shari Skinner's been having irregular bleeding and more cramping.  She would like the IUD removed.  She will use condoms for birth control in the interim.  She understands that she has an immediate return to fertility.  Review of Systems  Genitourinary:  Positive for menstrual problem and pelvic pain.       Objective:   Physical Exam Vitals reviewed.  Constitutional:      General: She is not in acute distress.    Appearance: She is well-developed.  HENT:     Head: Normocephalic and atraumatic.  Eyes:     Conjunctiva/sclera: Conjunctivae normal.  Cardiovascular:     Rate and Rhythm: Normal rate.  Pulmonary:     Effort: Pulmonary effort is normal.  Genitourinary:    Comments: Tanner V Vulva no lesion Vagina is pink, nml discharge Cervix--IUD strings barely visible. Skin:    General: Skin is warm and dry.  Neurological:     Mental Status: She is alert and oriented to person, place, and time.  Psychiatric:        Mood and Affect: Mood normal.    Vitals:   12/04/21 1057  BP: 114/77  Pulse: 85  Weight: 165 lb (74.8 kg)  Height: 5\' 3"  (1.6 m)      Assessment & Plan:  26 year old female with continued pelvic pain and menstrual problems from IUD.  Patient desires IUD removal today.  IUD Removal Patient identified, informed consent performed. Discussed risks of irregular bleeding, cramping, infection, malpositioning or misplacement of the IUD outside the uterus which may require further procedures. Time out was performed. Speculum placed in the vagina. Cervix visualized. The strings of the IUD were grasped and pulled using ring forceps. The IUD was successfully removed in its entirety.

## 2021-12-05 LAB — CERVICOVAGINAL ANCILLARY ONLY
Chlamydia: NEGATIVE
Comment: NEGATIVE
Comment: NORMAL
Neisseria Gonorrhea: NEGATIVE

## 2022-04-14 ENCOUNTER — Ambulatory Visit
Admission: RE | Admit: 2022-04-14 | Discharge: 2022-04-14 | Disposition: A | Discharge status: Home / Self Care | Payer: Medicaid Other | Source: Ambulatory Visit

## 2022-04-14 ENCOUNTER — Other Ambulatory Visit: Payer: Self-pay

## 2022-04-14 VITALS — BP 125/83 | HR 85 | Temp 98.6°F | Resp 20 | Ht 63.5 in | Wt 162.0 lb

## 2022-04-14 DIAGNOSIS — R1031 Right lower quadrant pain: Secondary | ICD-10-CM | POA: Diagnosis not present

## 2022-04-14 DIAGNOSIS — R110 Nausea: Secondary | ICD-10-CM

## 2022-04-14 MED ORDER — ONDANSETRON 4 MG PO TBDP
4.0000 mg | ORAL_TABLET | Freq: Once | ORAL | Status: AC
  Administered 2022-04-14: 4 mg via ORAL

## 2022-04-14 NOTE — Discharge Instructions (Addendum)
Advised patient to go to [facility] for further evaluation of right lower quadrant abdominal pain.

## 2022-04-14 NOTE — ED Triage Notes (Signed)
Pt presents to Urgent Care with c/o RLQ abdominal pain and vomiting x 3 days. Unsure if febrile. Pt reports being [redacted] weeks pregnant.

## 2022-04-14 NOTE — ED Notes (Signed)
Patient is being discharged from the Urgent Care and sent to the Emergency Department via POV. Per Trevor Iha FNP, patient is in need of higher level of care due to abd pain, vomiting and being [redacted]wks pregnant. Patient is aware and verbalizes understanding of plan of care.  Vitals:   04/14/22 1437  BP: 125/83  Pulse: 85  Resp: 20  Temp: 98.6 F (37 C)  SpO2: 98%

## 2022-04-14 NOTE — ED Provider Notes (Signed)
[facility]    CSN: [id number] Arrival date & time: 04/14/22  1412      History   Chief Complaint Chief Complaint  Patient presents with   Abdominal Pain    HPI Tika Hannis is a 26 y.o. female.   HPI 26 year old female presents with RLQ abdominal pain and vomiting for 3 days.  Patient reports appendix has been removed.  Patient reports that she is [redacted] weeks pregnant patient is accompanied by her significant other this afternoon PMH significant for anxiety, depression, and migraines.  Past Medical History:  Diagnosis Date   Anxiety    Depression    Migraines     Patient Active Problem List   Diagnosis Date Noted   Bipolar depression (HCC) 05/19/2020   Migraine with aura and without status migrainosus, not intractable 02/10/2019   Social anxiety disorder 12/26/2018   Presence of IUD 06/30/2018   Anxiety and depression 02/22/2017   MDD (major depressive disorder) 08/18/2012    Past Surgical History:  Procedure Laterality Date   APPENDECTOMY  2013   WISDOM TOOTH EXTRACTION      OB History     Gravida  3   Para  1   Term  1   Preterm      AB  1   Living  1      SAB  1   IAB      Ectopic      Multiple  0   Live Births  1            Home Medications    Prior to Admission medications   Medication Sig Start Date End Date Taking? Authorizing Provider  FLUoxetine (PROZAC) 20 MG capsule Take 30 mg by mouth daily.   Yes [provider]  Prenatal Vit-Fe Fumarate-FA (PRENATAL MULTIVITAMIN) TABS tablet Take 1 tablet by mouth daily at 12 noon.   Yes [provider]  buPROPion (WELLBUTRIN XL) 150 MG 24 hr tablet Take 150 mg by mouth daily. 05/31/21   [provider]  lamoTRIgine (LAMICTAL) 100 MG tablet Take by mouth. 09/19/20   [provider]  dicyclomine (BENTYL) 20 MG tablet Take by mouth. 05/06/19 05/04/20  [provider]  fluticasone Aleda Grana) 50 MCG/ACT nasal spray SPRAY 1 SPRAY  INTO EACH NOSTRIL EVERY DAY 04/03/19 05/04/20  [provider]  mirtazapine (REMERON) 7.5 MG tablet every evening. 01/18/19 05/04/20  [provider]  SUMAtriptan (IMITREX) 25 MG tablet  02/09/19 05/04/20  [provider]    Family History Family History  Problem Relation Age of Onset   Diabetes Mother    Diabetes Father    Lung cancer Paternal Grandmother    Cervical cancer Paternal Grandmother     Social History Social History   Tobacco Use   Smoking status: Former    Packs/day: 0.50    Years: 3.00    Total pack years: 1.50    Types: Cigarettes    Quit date: 09/25/2016    Years since quitting: 5.5   Smokeless tobacco: Never  Vaping Use   Vaping Use: Never used  Substance Use Topics   Alcohol use: No   Drug use: No     Allergies   Amoxicillin, Clindamycin/lincomycin, Hydrocodone-acetaminophen, Sertraline, and Strawberry leaves extract   Review of Systems Review of Systems   Physical Exam Triage Vital Signs ED Triage Vitals  Enc Vitals Group     BP 04/14/22 1437 125/83     Pulse Rate 04/14/22 1437  85     Resp 04/14/22 1437 20     Temp 04/14/22 1437 98.6 F (37 C)     Temp Source 04/14/22 1437 Oral     SpO2 04/14/22 1437 98 %     Weight 04/14/22 1433 162 lb (73.5 kg)     Height 04/14/22 1433 5' 3.5" (1.613 m)     Head Circumference --      Peak Flow --      Pain Score 04/14/22 1432 3     Pain Loc --      Pain Edu? --      Excl. in [place]? --    No data found.  Updated Vital Signs BP 125/83 (BP Location: Right Arm)   Pulse 85   Temp 98.6 F (37 C) (Oral)   Resp 20   Ht 5' 3.5" (1.613 m)   Wt 162 lb (73.5 kg)   LMP 02/28/2022 (Exact Date)   SpO2 98%   BMI 28.25 kg/m   Visual Acuity Right Eye Distance:   Left Eye Distance:   Bilateral Distance:    Right Eye Near:   Left Eye Near:    Bilateral Near:     Physical Exam Vitals reviewed.  Constitutional:      General: She is not in acute distress.    Appearance: She is  well-developed and normal weight. She is not ill-appearing.  HENT:     Head: Normocephalic and atraumatic.     Mouth/Throat:     Mouth: Mucous membranes are moist.     Pharynx: Oropharynx is clear.  Eyes:     Extraocular Movements: Extraocular movements intact.     Pupils: Pupils are equal, round, and reactive to light.  Cardiovascular:     Rate and Rhythm: Normal rate and regular rhythm.     Heart sounds: Normal heart sounds.  Pulmonary:     Effort: Pulmonary effort is normal.     Breath sounds: Normal breath sounds. No wheezing, rhonchi or rales.  Abdominal:     General: Abdomen is flat. Bowel sounds are absent. There is no distension.     Palpations: Abdomen is soft. There is no shifting dullness, fluid wave, hepatomegaly, splenomegaly, mass or pulsatile mass.     Tenderness: There is abdominal tenderness in the right lower quadrant. There is no right CVA tenderness, left CVA tenderness, guarding or rebound. Negative signs include Murphy's sign, Rovsing's sign and McBurney's sign.     Hernia: No hernia is present.  Skin:    General: Skin is warm and dry.  Neurological:     General: No focal deficit present.     Mental Status: She is alert and oriented to person, place, and time.      UC Treatments / Results  Labs (all labs ordered are listed, but only abnormal results are displayed) Labs Reviewed - No data to display  EKG   Radiology No results found.  Procedures Procedures (including critical care time)  Medications Ordered in UC Medications  ondansetron (ZOFRAN-ODT) disintegrating tablet 4 mg (4 mg Oral Given 04/14/22 1442)    Initial Impression / Assessment and Plan / UC Course  I have reviewed the triage vital signs and the nursing notes.  Pertinent labs & imaging results that were available during my care of the patient were reviewed by me and considered in my medical decision making (see chart for details).    MDM: 1.  Right lower quadrant abdominal  pain-Advised patient to go to [facility] [place] [facility] for further evaluation of right lower quadrant abdominal pain.  Patient/significant other agreed and verbalized understanding of these instructions and this plan of care today. 2. Nausea-Zofran 4 mg given once in clinic prior to discharge.  Patient discharged hemodynamically stable.  Final Clinical Impressions(s) / UC Diagnoses   Final diagnoses:  Right lower quadrant abdominal pain  Nausea     Discharge Instructions      Advised patient to go to [facility] ED for further evaluation of right lower quadrant abdominal pain.     ED Prescriptions   None    PDMP not reviewed this encounter.   Eliezer Lofts, [place] 04/14/22 1455

## 2022-05-08 ENCOUNTER — Encounter: Payer: Self-pay | Admitting: *Deleted

## 2022-05-08 DIAGNOSIS — Z3490 Encounter for supervision of normal pregnancy, unspecified, unspecified trimester: Secondary | ICD-10-CM | POA: Insufficient documentation

## 2022-05-10 ENCOUNTER — Ambulatory Visit: Payer: Medicaid Other | Admitting: Obstetrics and Gynecology

## 2022-05-10 DIAGNOSIS — Z3490 Encounter for supervision of normal pregnancy, unspecified, unspecified trimester: Secondary | ICD-10-CM

## 2022-05-11 NOTE — Progress Notes (Signed)
Ms. Cleere did not attend her new OB appointment  Harvie Bridge, MD Obstetrician & Gynecologist, [facility] for [facility], [facility]
